# Patient Record
Sex: Male | Born: 1955
Health system: Southern US, Community
[De-identification: ages and names within clinical notes are randomized; demographics above are authoritative.]

## PROBLEM LIST (undated history)

## (undated) DIAGNOSIS — I219 Acute myocardial infarction, unspecified: Secondary | ICD-10-CM

## (undated) DIAGNOSIS — D696 Thrombocytopenia, unspecified: Secondary | ICD-10-CM

## (undated) DIAGNOSIS — K635 Polyp of colon: Secondary | ICD-10-CM

## (undated) DIAGNOSIS — I519 Heart disease, unspecified: Secondary | ICD-10-CM

## (undated) DIAGNOSIS — E039 Hypothyroidism, unspecified: Secondary | ICD-10-CM

## (undated) DIAGNOSIS — G4730 Sleep apnea, unspecified: Secondary | ICD-10-CM

## (undated) DIAGNOSIS — J189 Pneumonia, unspecified organism: Secondary | ICD-10-CM

## (undated) DIAGNOSIS — E119 Type 2 diabetes mellitus without complications: Secondary | ICD-10-CM

## (undated) DIAGNOSIS — T7840XA Allergy, unspecified, initial encounter: Secondary | ICD-10-CM

## (undated) DIAGNOSIS — I10 Essential (primary) hypertension: Secondary | ICD-10-CM

## (undated) DIAGNOSIS — K760 Fatty (change of) liver, not elsewhere classified: Secondary | ICD-10-CM

## (undated) DIAGNOSIS — K602 Anal fissure, unspecified: Secondary | ICD-10-CM

## (undated) DIAGNOSIS — E785 Hyperlipidemia, unspecified: Secondary | ICD-10-CM

## (undated) HISTORY — DX: Thrombocytopenia, unspecified: D69.6

## (undated) HISTORY — DX: Essential (primary) hypertension: I10

## (undated) HISTORY — PX: CARDIAC CATHETERIZATION: SHX172

## (undated) HISTORY — DX: Heart disease, unspecified: I51.9

## (undated) HISTORY — DX: Allergy, unspecified, initial encounter: T78.40XA

## (undated) HISTORY — DX: Hyperlipidemia, unspecified: E78.5

## (undated) HISTORY — PX: NASAL SINUS SURGERY: SHX719

## (undated) HISTORY — DX: Polyp of colon: K63.5

## (undated) HISTORY — PX: ANAL FISSURE REPAIR: SHX2312

## (undated) HISTORY — DX: Type 2 diabetes mellitus without complications: E11.9

## (undated) HISTORY — DX: Acute myocardial infarction, unspecified: I21.9

## (undated) HISTORY — PX: COLONOSCOPY: SHX174

---

## 1998-11-22 DIAGNOSIS — J189 Pneumonia, unspecified organism: Secondary | ICD-10-CM | POA: Insufficient documentation

## 1998-11-22 HISTORY — DX: Pneumonia, unspecified organism: J18.9

## 2003-02-06 ENCOUNTER — Encounter: Payer: Self-pay | Admitting: Internal Medicine

## 2003-02-06 ENCOUNTER — Encounter: Admission: RE | Admit: 2003-02-06 | Discharge: 2003-02-06 | Payer: Self-pay | Admitting: Internal Medicine

## 2003-02-11 ENCOUNTER — Encounter: Admission: RE | Admit: 2003-02-11 | Discharge: 2003-02-11 | Payer: Self-pay | Admitting: Internal Medicine

## 2003-02-11 ENCOUNTER — Encounter: Payer: Self-pay | Admitting: Internal Medicine

## 2006-11-22 DIAGNOSIS — I219 Acute myocardial infarction, unspecified: Secondary | ICD-10-CM

## 2006-11-22 HISTORY — DX: Acute myocardial infarction, unspecified: I21.9

## 2007-03-15 ENCOUNTER — Ambulatory Visit: Payer: Self-pay | Admitting: Cardiothoracic Surgery

## 2010-12-12 ENCOUNTER — Encounter: Payer: Self-pay | Admitting: Sports Medicine

## 2013-11-22 HISTORY — PX: COLOSTOMY: SHX63

## 2014-11-26 LAB — HM COLONOSCOPY

## 2015-05-22 DIAGNOSIS — E782 Mixed hyperlipidemia: Secondary | ICD-10-CM | POA: Insufficient documentation

## 2015-05-22 DIAGNOSIS — I2510 Atherosclerotic heart disease of native coronary artery without angina pectoris: Secondary | ICD-10-CM | POA: Insufficient documentation

## 2015-05-22 DIAGNOSIS — I252 Old myocardial infarction: Secondary | ICD-10-CM

## 2015-05-22 HISTORY — DX: Mixed hyperlipidemia: E78.2

## 2015-05-22 HISTORY — DX: Atherosclerotic heart disease of native coronary artery without angina pectoris: I25.10

## 2015-05-22 HISTORY — DX: Old myocardial infarction: I25.2

## 2015-12-16 DIAGNOSIS — E291 Testicular hypofunction: Secondary | ICD-10-CM | POA: Insufficient documentation

## 2015-12-16 DIAGNOSIS — E039 Hypothyroidism, unspecified: Secondary | ICD-10-CM | POA: Insufficient documentation

## 2015-12-16 DIAGNOSIS — D696 Thrombocytopenia, unspecified: Secondary | ICD-10-CM

## 2015-12-16 DIAGNOSIS — Z8249 Family history of ischemic heart disease and other diseases of the circulatory system: Secondary | ICD-10-CM | POA: Insufficient documentation

## 2015-12-16 HISTORY — DX: Testicular hypofunction: E29.1

## 2015-12-16 HISTORY — DX: Thrombocytopenia, unspecified: D69.6

## 2015-12-16 HISTORY — DX: Family history of ischemic heart disease and other diseases of the circulatory system: Z82.49

## 2015-12-16 HISTORY — DX: Hypothyroidism, unspecified: E03.9

## 2017-04-11 ENCOUNTER — Other Ambulatory Visit (HOSPITAL_COMMUNITY): Payer: Self-pay | Admitting: General Surgery

## 2017-04-27 ENCOUNTER — Encounter: Payer: BLUE CROSS/BLUE SHIELD | Attending: General Surgery | Admitting: Registered"

## 2017-04-27 ENCOUNTER — Encounter: Payer: Self-pay | Admitting: Registered"

## 2017-04-27 ENCOUNTER — Ambulatory Visit: Payer: Self-pay | Admitting: Registered"

## 2017-04-27 DIAGNOSIS — E079 Disorder of thyroid, unspecified: Secondary | ICD-10-CM | POA: Diagnosis not present

## 2017-04-27 DIAGNOSIS — Z6841 Body Mass Index (BMI) 40.0 and over, adult: Secondary | ICD-10-CM | POA: Insufficient documentation

## 2017-04-27 DIAGNOSIS — I10 Essential (primary) hypertension: Secondary | ICD-10-CM | POA: Insufficient documentation

## 2017-04-27 DIAGNOSIS — E1169 Type 2 diabetes mellitus with other specified complication: Secondary | ICD-10-CM | POA: Insufficient documentation

## 2017-04-27 DIAGNOSIS — E669 Obesity, unspecified: Secondary | ICD-10-CM | POA: Diagnosis not present

## 2017-04-27 DIAGNOSIS — E119 Type 2 diabetes mellitus without complications: Secondary | ICD-10-CM

## 2017-04-27 DIAGNOSIS — G4733 Obstructive sleep apnea (adult) (pediatric): Secondary | ICD-10-CM | POA: Insufficient documentation

## 2017-04-27 DIAGNOSIS — Z713 Dietary counseling and surveillance: Secondary | ICD-10-CM | POA: Diagnosis not present

## 2017-04-27 NOTE — Progress Notes (Addendum)
Pre-Op Assessment Visit:  Pre-Operative Sleeve Gastrectomy Surgery  Medical Nutrition Therapy:  Appt start time: 2:05 End time:  3:18  Patient was seen on 04/27/2017 for Pre-Operative Nutrition Assessment. Assessment and letter of approval faxed to [facility] Bariatric Surgery Program coordinator on 04/27/2017.   Pt expectation of surgery: "to get comfortable in my own skin and better health"  Pt expectation of Dietitian: accountability with eating the right things and doing the right things   Start weight at [facility]: 329.5 BMI: 46.61   Pt states he has an intolerance to prednisone tablets especially during summer months.  Pt states he has been newly diagnosed with diabetes for about 5 weeks now.   Pt may have surgery in 2 weeks, during week of 6/25.  Pt is self-pay and unsure of how many SWL visits he'll have.    24 hr Dietary Recall: First Meal: take out - bacon, eggs, grits, bisucuit Snack: sometimes in-season fruit  Second Meal: take out - meat and 3 vegetables Snack: sometimes strawberries when in season Third Meal: meat and vegetables or burger or pizza Snack: watermelon, m&m's Beverages: coffee, half and half tea, water, soda  Encouraged to engage in 150 minutes of moderate physical activity including cardiovascular and weight baring weekly  Handouts given during visit include:  . Pre-Op Goals . Bariatric Surgery Protein Shakes . Vitamin and Mineral Recommendations  During the appointment today the following Pre-Op Goals were reviewed with the patient: . Maintain or lose weight as instructed by your surgeon . Make healthy food choices . Begin to limit portion sizes . Limited concentrated sugars and fried foods . Keep fat/sugar in the single digits per serving on         food labels . Practice CHEWING your food  (aim for 30 chews per bite or until applesauce consistency) . Practice not drinking 15 minutes before, during, and 30 minutes after each  meal/snack . Avoid all carbonated beverages  . Avoid/limit caffeinated beverages  . Avoid all sugar-sweetened beverages . Consume 3 meals per day; eat every 3-5 hours . Make a list of non-food related activities . Aim for 64-100 ounces of FLUID daily  . Aim for at least 60-80 grams of PROTEIN daily . Look for a liquid protein source that contain ?15 g protein and ?5 g carbohydrate  (ex: shakes, drinks, shots) . Physical activity is an important part of a healthy lifestyle so keep it moving!  Follow diet recommendations listed below Energy and Macronutrient Recommendations: Calories: 2000 Carbohydrate: 225 Protein: 150 Fat: 56  Demonstrated degree of understanding via:  Teach Back   Teaching Method Utilized:  Visual Auditory Hands on  Barriers to learning/adherence to lifestyle change: none  Patient to call the [facility] to enroll in Pre-Op and Post-Op Nutrition Education when surgery date is scheduled.

## 2017-05-09 ENCOUNTER — Ambulatory Visit (HOSPITAL_COMMUNITY)
Admission: RE | Admit: 2017-05-09 | Discharge: 2017-05-09 | Disposition: A | Discharge status: Home / Self Care | Payer: BLUE CROSS/BLUE SHIELD | Source: Ambulatory Visit | Attending: General Surgery | Admitting: General Surgery

## 2017-05-09 DIAGNOSIS — I4510 Unspecified right bundle-branch block: Secondary | ICD-10-CM | POA: Diagnosis not present

## 2017-05-09 DIAGNOSIS — Z01812 Encounter for preprocedural laboratory examination: Secondary | ICD-10-CM | POA: Diagnosis present

## 2017-05-09 DIAGNOSIS — Z01810 Encounter for preprocedural cardiovascular examination: Secondary | ICD-10-CM | POA: Diagnosis not present

## 2017-05-16 ENCOUNTER — Encounter: Payer: BLUE CROSS/BLUE SHIELD | Admitting: Skilled Nursing Facility1

## 2017-05-16 DIAGNOSIS — Z713 Dietary counseling and surveillance: Secondary | ICD-10-CM | POA: Diagnosis not present

## 2017-05-16 DIAGNOSIS — E119 Type 2 diabetes mellitus without complications: Secondary | ICD-10-CM

## 2017-05-18 ENCOUNTER — Ambulatory Visit: Payer: Self-pay | Admitting: General Surgery

## 2017-05-18 NOTE — Progress Notes (Signed)
  Pre-Operative Nutrition Class:  Appt start time: 7530   End time:  1830.  Patient was seen on 05/16/17 for Pre-Operative Bariatric Surgery Education [facility].   Surgery date: 05/30/17 Surgery type: Sleeve Gastrectomy Start weight at [facility]: 329.5 Weight today: Pt denied  TANITA  BODY COMP RESULTS     BMI (kg/m^2)    Fat Mass (lbs)    Fat Free Mass (lbs)    Total Body Water (lbs)    Samples given per MNT protocol. Patient educated on appropriate usage: Bariatric Advantage Multivitamin Lot # [id number] Exp: 06/19  Bariatric Fusion Calcium Citrate Lot # [id number] Exp: 03/23/18  Premier Clear Protein Drink Lot # [id number] Exp: 10/18/17 The following the learning objectives were met by the patient during this course:  Identify Pre-Op Dietary Goals and will begin 2 weeks pre-operatively  Identify appropriate sources of fluids and proteins   State protein recommendations and appropriate sources pre and post-operatively  Identify Post-Operative Dietary Goals and will follow for 2 weeks post-operatively  Identify appropriate multivitamin and calcium sources  Describe the need for physical activity post-operatively and will follow MD recommendations  State when to call healthcare provider regarding medication questions or post-operative complications  Handouts given during class include:  Pre-Op Bariatric Surgery Diet Handout  Protein Shake Handout  Post-Op Bariatric Surgery Nutrition Handout  [facility] Program Information Flyer  Support Group Information Flyer  [facility] Bariatric Supplements Price List  Follow-Up Plan: Patient will follow-up at [facility] 2 weeks post operatively for diet advancement per MD.

## 2017-05-23 ENCOUNTER — Telehealth (HOSPITAL_COMMUNITY): Payer: Self-pay

## 2017-05-23 NOTE — Telephone Encounter (Signed)
Spoke with patient about admitting process for bariatric surgery.  Patient for pre admit tomorrow and surgery on July 9th.  Questions answered.

## 2017-05-23 NOTE — Progress Notes (Signed)
LOV [place] 05-11-17 on chart   LOV cardiology Chiu MD 06-09-16 epic  EKG 05-09-17 epic  CXR 05-09-17 epic  HgA1c 05-11-17 on chart

## 2017-05-23 NOTE — Patient Instructions (Addendum)
Scott Bauer  05/23/2017   Your procedure is scheduled on: 05-30-17  Report to [facility] Main  Entrance Take [facility]  elevators to 3rd floor to  [place] at 11:15AM.    Call this number if you have problems the morning of surgery [phone]   Remember: ONLY 1 PERSON MAY GO WITH YOU TO SHORT STAY TO GET  READY MORNING OF [place].  Do not eat food After Midnight. You may have clear liquids form midnight until 715am day of surgery. Nothing by mouth after 715am!     Take these medicines the morning of surgery with A SIP OF WATER: carvedilol(coreg), levothyroxine(synthroid)   DO NOT TAKE ANY DIABETIC MEDICATIONS DAY OF YOUR SURGERY                               You may not have any metal on your body including hair pins and              piercings  Do not wear jewelry, make-up, lotions, powders or perfumes, deodorant                   Men may shave face and neck.   Do not bring valuables to the hospital. [place].  Contacts, dentures or bridgework may not be worn into surgery.  Leave suitcase in the car. After surgery it may be brought to your room.                Please read over the following fact sheets you were given: _____________________________________________________________________    CLEAR LIQUID DIET   Foods Allowed                                                                     Foods Excluded  Coffee and tea, regular and decaf                             liquids that you cannot  Plain Jell-O in any flavor                                             see through such as: Fruit ices (not with fruit pulp)                                     milk, soups, orange juice  Iced Popsicles                                    All solid food Carbonated beverages, regular and diet  Cranberry, grape and apple juices Sports drinks like Gatorade Lightly  seasoned clear broth or consume(fat free) Sugar, honey syrup  Sample Menu Breakfast                                Lunch                                     Supper Cranberry juice                    Beef broth                            Chicken broth Jell-O                                     Grape juice                           Apple juice Coffee or tea                        Jell-O                                      Popsicle                                                Coffee or tea                        Coffee or tea  _____________________________________________________________________ How to Manage Your Diabetes Before and After Surgery  Why is it important to control my blood sugar before and after surgery? . Improving blood sugar levels before and after surgery helps healing and can limit problems. . A way of improving blood sugar control is eating a healthy diet by: o  Eating less sugar and carbohydrates o  Increasing activity/exercise o  Talking with your doctor about reaching your blood sugar goals . High blood sugars (greater than 180 mg/dL) can raise your risk of infections and slow your recovery, so you will need to focus on controlling your diabetes during the weeks before surgery. . Make sure that the doctor who takes care of your diabetes knows about your planned surgery including the date and location.  How do I manage my blood sugar before surgery? . Check your blood sugar at least 4 times a day, starting 2 days before surgery, to make sure that the level is not too high or low. o Check your blood sugar the morning of your surgery when you wake up and every 2 hours until you get to the Short Stay unit. . If your blood sugar is less than 70 mg/dL, you will need to treat for low blood sugar: o Do not take insulin. o Treat a low blood sugar (less than 70 mg/dL) with  cup of clear juice (cranberry or apple), 4 glucose tablets, OR glucose gel. o Recheck  blood sugar in  15 minutes after treatment (to make sure it is greater than 70 mg/dL). If your blood sugar is not greater than 70 mg/dL on recheck, call [phone] for further instructions. . Report your blood sugar to the short stay nurse when you get to Short Stay.  . If you are admitted to the hospital after surgery: o Your blood sugar will be checked by the staff and you will probably be given insulin after surgery (instead of oral diabetes medicines) to make sure you have good blood sugar levels. o The goal for blood sugar control after surgery is 80-180 mg/dL.   WHAT DO I DO ABOUT MY DIABETES MEDICATION?   . THE DAY BEFORE SURGERY, take  METFORMIN and SAXAGLIPTIN(ONGLYZA) as usual.       . THE MORNING OF SURGERY, Do not take oral diabetes medicines (pills)   Patient Signature:  Date:   Nurse Signature:  Date:   Reviewed and Endorsed by [place] Patient Education Committee, August 2015   [facility] - Preparing for Surgery Before surgery, you can play an important role.  Because skin is not sterile, your skin needs to be as free of germs as possible.  You can reduce the number of germs on your skin by washing with CHG (chlorahexidine gluconate) soap before surgery.  CHG is an antiseptic cleaner which kills germs and bonds with the skin to continue killing germs even after washing. Please DO NOT use if you have an allergy to CHG or antibacterial soaps.  If your skin becomes reddened/irritated stop using the CHG and inform your nurse when you arrive at Short Stay. Do not shave (including legs and underarms) for at least 48 hours prior to the first CHG shower.  You may shave your face/neck. Please follow these instructions carefully:  1.  Shower with CHG Soap the night before surgery and the  morning of Surgery.  2.  If you choose to wash your hair, wash your hair first as usual with your  normal  shampoo.  3.  After you shampoo, rinse your hair and body thoroughly to remove the  shampoo.                            4.  Use CHG as you would any other liquid soap.  You can apply chg directly  to the skin and wash                       Gently with a scrungie or clean washcloth.  5.  Apply the CHG Soap to your body ONLY FROM THE NECK DOWN.   Do not use on face/ open                           Wound or open sores. Avoid contact with eyes, ears mouth and genitals (private parts).                       Wash face,  Genitals (private parts) with your normal soap.             6.  Wash thoroughly, paying special attention to the area where your surgery  will be performed.  7.  Thoroughly rinse your body with warm water from the neck down.  8.  DO NOT shower/wash with your normal soap after using and rinsing off  the  CHG Soap.                9.  Pat yourself dry with a clean towel.            10.  Wear clean pajamas.            11.  Place clean sheets on your bed the night of your first shower and do not  sleep with pets. Day of Surgery : Do not apply any lotions/deodorants the morning of surgery.  Please wear clean clothes to the hospital/surgery center.  FAILURE TO FOLLOW THESE INSTRUCTIONS MAY RESULT IN THE CANCELLATION OF YOUR SURGERY PATIENT SIGNATURE_________________________________  NURSE SIGNATURE__________________________________  ________________________________________________________________________   Adam Phenix  An incentive spirometer is a tool that can help keep your lungs clear and active. This tool measures how well you are filling your lungs with each breath. Taking long deep breaths may help reverse or decrease the chance of developing breathing (pulmonary) problems (especially infection) following:  A long period of time when you are unable to move or be active. BEFORE THE PROCEDURE   If the spirometer includes an indicator to show your best effort, your nurse or respiratory therapist will set it to a desired goal.  If possible, sit up straight or lean  slightly forward. Try not to slouch.  Hold the incentive spirometer in an upright position. INSTRUCTIONS FOR USE  1. Sit on the edge of your bed if possible, or sit up as far as you can in bed or on a chair. 2. Hold the incentive spirometer in an upright position. 3. Breathe out normally. 4. Place the mouthpiece in your mouth and seal your lips tightly around it. 5. Breathe in slowly and as deeply as possible, raising the piston or the ball toward the top of the column. 6. Hold your breath for 3-5 seconds or for as long as possible. Allow the piston or ball to fall to the bottom of the column. 7. Remove the mouthpiece from your mouth and breathe out normally. 8. Rest for a few seconds and repeat Steps 1 through 7 at least 10 times every 1-2 hours when you are awake. Take your time and take a few normal breaths between deep breaths. 9. The spirometer may include an indicator to show your best effort. Use the indicator as a goal to work toward during each repetition. 10. After each set of 10 deep breaths, practice coughing to be sure your lungs are clear. If you have an incision (the cut made at the time of surgery), support your incision when coughing by placing a pillow or rolled up towels firmly against it. Once you are able to get out of bed, walk around indoors and cough well. You may stop using the incentive spirometer when instructed by your caregiver.  RISKS AND COMPLICATIONS  Take your time so you do not get dizzy or light-headed.  If you are in pain, you may need to take or ask for pain medication before doing incentive spirometry. It is harder to take a deep breath if you are having pain. AFTER USE  Rest and breathe slowly and easily.  It can be helpful to keep track of a log of your progress. Your caregiver can provide you with a simple table to help with this. If you are using the spirometer at home, follow these instructions: [place] IF:   You are having difficultly  using the spirometer.  You have trouble using the spirometer as often  as instructed.  Your pain medication is not giving enough relief while using the spirometer.  You develop fever of 100.5 F (38.1 C) or higher. SEEK IMMEDIATE MEDICAL CARE IF:   You cough up bloody sputum that had not been present before.  You develop fever of 102 F (38.9 C) or greater.  You develop worsening pain at or near the incision site. MAKE SURE YOU:   Understand these instructions.  Will watch your condition.  Will get help right away if you are not doing well or get worse. Document Released: 03/21/2007 Document Revised: 01/31/2012 Document Reviewed: 05/22/2007 [facility] Patient Information 2014 [place], [place].   ________________________________________________________________________

## 2017-05-24 ENCOUNTER — Encounter (HOSPITAL_COMMUNITY)
Admission: RE | Admit: 2017-05-24 | Discharge: 2017-05-24 | Disposition: A | Discharge status: Home / Self Care | Payer: BLUE CROSS/BLUE SHIELD | Source: Ambulatory Visit | Attending: General Surgery | Admitting: General Surgery

## 2017-05-24 ENCOUNTER — Encounter (HOSPITAL_COMMUNITY): Payer: Self-pay

## 2017-05-24 DIAGNOSIS — I10 Essential (primary) hypertension: Secondary | ICD-10-CM | POA: Insufficient documentation

## 2017-05-24 DIAGNOSIS — Z01812 Encounter for preprocedural laboratory examination: Secondary | ICD-10-CM | POA: Insufficient documentation

## 2017-05-24 DIAGNOSIS — Z0183 Encounter for blood typing: Secondary | ICD-10-CM | POA: Insufficient documentation

## 2017-05-24 DIAGNOSIS — G4733 Obstructive sleep apnea (adult) (pediatric): Secondary | ICD-10-CM | POA: Insufficient documentation

## 2017-05-24 DIAGNOSIS — Z6841 Body Mass Index (BMI) 40.0 and over, adult: Secondary | ICD-10-CM | POA: Insufficient documentation

## 2017-05-24 HISTORY — DX: Pneumonia, unspecified organism: J18.9

## 2017-05-24 HISTORY — DX: Hypothyroidism, unspecified: E03.9

## 2017-05-24 HISTORY — DX: Anal fissure, unspecified: K60.2

## 2017-05-24 HISTORY — DX: Sleep apnea, unspecified: G47.30

## 2017-05-24 HISTORY — DX: Fatty (change of) liver, not elsewhere classified: K76.0

## 2017-05-24 LAB — COMPREHENSIVE METABOLIC PANEL
ALBUMIN: 4.3 g/dL (ref 3.5–5.0)
ALK PHOS: 108 U/L (ref 38–126)
ALT: 53 U/L (ref 17–63)
ANION GAP: 9 (ref 5–15)
AST: 34 U/L (ref 15–41)
BILIRUBIN TOTAL: 0.8 mg/dL (ref 0.3–1.2)
BUN: 18 mg/dL (ref 6–20)
CALCIUM: 9.2 mg/dL (ref 8.9–10.3)
CO2: 22 mmol/L (ref 22–32)
Chloride: 106 mmol/L (ref 101–111)
Creatinine, Ser: 0.71 mg/dL (ref 0.61–1.24)
GFR calc Af Amer: >60 mL/min (ref >60)
GFR calc non Af Amer: >60 mL/min (ref >60)
GLUCOSE: 113 mg/dL — AB (ref 65–99)
POTASSIUM: 3.7 mmol/L (ref 3.5–5.1)
Sodium: 137 mmol/L (ref 135–145)
TOTAL PROTEIN: 7.6 g/dL (ref 6.5–8.1)

## 2017-05-24 LAB — CBC WITH DIFFERENTIAL/PLATELET
BASOS ABS: 0 10*3/uL (ref 0.0–0.1)
BASOS PCT: 0 %
EOS ABS: 0.2 10*3/uL (ref 0.0–0.7)
Eosinophils Relative: 2 %
HEMATOCRIT: 40.8 % (ref 39.0–52.0)
HEMOGLOBIN: 14.6 g/dL (ref 13.0–17.0)
Lymphocytes Relative: 25 %
Lymphs Abs: 2 10*3/uL (ref 0.7–4.0)
MCH: 30.1 pg (ref 26.0–34.0)
MCHC: 35.8 g/dL (ref 30.0–36.0)
MCV: 84.1 fL (ref 78.0–100.0)
Monocytes Absolute: 0.8 10*3/uL (ref 0.1–1.0)
Monocytes Relative: 9 %
NEUTROS ABS: 5.4 10*3/uL (ref 1.7–7.7)
NEUTROS PCT: 64 %
Platelets: 133 10*3/uL — ABNORMAL LOW (ref 150–400)
RBC: 4.85 MIL/uL (ref 4.22–5.81)
RDW: 13.2 % (ref 11.5–15.5)
WBC: 8.3 10*3/uL (ref 4.0–10.5)

## 2017-05-24 LAB — ABO/RH: ABO/RH(D): O POS

## 2017-05-24 LAB — GLUCOSE, CAPILLARY: Glucose-Capillary: 136 mg/dL — ABNORMAL HIGH (ref 65–99)

## 2017-05-24 MED FILL — oxyCODONE HCL 5 MG/5ML SOLN: 5 | 2 days supply | Qty: 100 | Fill #0

## 2017-05-24 NOTE — Progress Notes (Signed)
Cardiology clearance Dr Wyline Copas on chart

## 2017-05-24 NOTE — Progress Notes (Signed)
RN called medical records at [place] , [place] requesting copy of cardiac clearance from Dr Wyline Copas

## 2017-05-30 ENCOUNTER — Observation Stay (HOSPITAL_COMMUNITY)
Admission: RE | Admit: 2017-05-30 | Discharge: 2017-05-31 | Disposition: A | Discharge status: Home / Self Care | Payer: Self-pay | Source: Ambulatory Visit | Attending: General Surgery | Admitting: General Surgery

## 2017-05-30 ENCOUNTER — Ambulatory Visit (HOSPITAL_COMMUNITY): Payer: BLUE CROSS/BLUE SHIELD | Admitting: Registered Nurse

## 2017-05-30 ENCOUNTER — Encounter (HOSPITAL_COMMUNITY)
Admission: RE | Disposition: A | Discharge status: Home / Self Care | Payer: Self-pay | Source: Ambulatory Visit | Attending: General Surgery

## 2017-05-30 ENCOUNTER — Encounter (HOSPITAL_COMMUNITY): Payer: Self-pay | Admitting: *Deleted

## 2017-05-30 DIAGNOSIS — E7800 Pure hypercholesterolemia, unspecified: Secondary | ICD-10-CM

## 2017-05-30 DIAGNOSIS — Z9989 Dependence on other enabling machines and devices: Secondary | ICD-10-CM | POA: Insufficient documentation

## 2017-05-30 DIAGNOSIS — Z7902 Long term (current) use of antithrombotics/antiplatelets: Secondary | ICD-10-CM | POA: Insufficient documentation

## 2017-05-30 DIAGNOSIS — Z6841 Body Mass Index (BMI) 40.0 and over, adult: Secondary | ICD-10-CM | POA: Insufficient documentation

## 2017-05-30 DIAGNOSIS — G4733 Obstructive sleep apnea (adult) (pediatric): Secondary | ICD-10-CM

## 2017-05-30 DIAGNOSIS — E1169 Type 2 diabetes mellitus with other specified complication: Secondary | ICD-10-CM | POA: Diagnosis present

## 2017-05-30 DIAGNOSIS — I10 Essential (primary) hypertension: Secondary | ICD-10-CM | POA: Diagnosis present

## 2017-05-30 DIAGNOSIS — E66813 Obesity, class 3: Secondary | ICD-10-CM

## 2017-05-30 DIAGNOSIS — T884XXA Failed or difficult intubation, initial encounter: Secondary | ICD-10-CM

## 2017-05-30 DIAGNOSIS — Z87891 Personal history of nicotine dependence: Secondary | ICD-10-CM | POA: Insufficient documentation

## 2017-05-30 DIAGNOSIS — E119 Type 2 diabetes mellitus without complications: Secondary | ICD-10-CM | POA: Insufficient documentation

## 2017-05-30 DIAGNOSIS — Z888 Allergy status to other drugs, medicaments and biological substances status: Secondary | ICD-10-CM | POA: Insufficient documentation

## 2017-05-30 DIAGNOSIS — I252 Old myocardial infarction: Secondary | ICD-10-CM | POA: Insufficient documentation

## 2017-05-30 DIAGNOSIS — E669 Obesity, unspecified: Secondary | ICD-10-CM | POA: Diagnosis present

## 2017-05-30 DIAGNOSIS — Z79899 Other long term (current) drug therapy: Secondary | ICD-10-CM | POA: Insufficient documentation

## 2017-05-30 DIAGNOSIS — Z7984 Long term (current) use of oral hypoglycemic drugs: Secondary | ICD-10-CM | POA: Insufficient documentation

## 2017-05-30 DIAGNOSIS — Z7982 Long term (current) use of aspirin: Secondary | ICD-10-CM | POA: Insufficient documentation

## 2017-05-30 DIAGNOSIS — K219 Gastro-esophageal reflux disease without esophagitis: Secondary | ICD-10-CM | POA: Insufficient documentation

## 2017-05-30 DIAGNOSIS — E039 Hypothyroidism, unspecified: Secondary | ICD-10-CM | POA: Insufficient documentation

## 2017-05-30 DIAGNOSIS — I4510 Unspecified right bundle-branch block: Secondary | ICD-10-CM | POA: Insufficient documentation

## 2017-05-30 HISTORY — PX: LAPAROSCOPIC GASTRIC SLEEVE RESECTION: SHX5895

## 2017-05-30 HISTORY — DX: Morbid (severe) obesity due to excess calories: E66.01

## 2017-05-30 HISTORY — DX: Pure hypercholesterolemia, unspecified: E78.00

## 2017-05-30 HISTORY — DX: Failed or difficult intubation, initial encounter: T88.4XXA

## 2017-05-30 HISTORY — DX: Obstructive sleep apnea (adult) (pediatric): G47.33

## 2017-05-30 HISTORY — DX: Essential (primary) hypertension: I10

## 2017-05-30 HISTORY — DX: Obesity, class 3: E66.813

## 2017-05-30 HISTORY — DX: Type 2 diabetes mellitus with other specified complication: E66.9

## 2017-05-30 HISTORY — DX: Type 2 diabetes mellitus with other specified complication: E11.69

## 2017-05-30 LAB — TYPE AND SCREEN
ABO/RH(D): O POS
Antibody Screen: NEGATIVE

## 2017-05-30 LAB — GLUCOSE, CAPILLARY
GLUCOSE-CAPILLARY: 104 mg/dL — AB (ref 65–99)
GLUCOSE-CAPILLARY: 159 mg/dL — AB (ref 65–99)
Glucose-Capillary: 160 mg/dL — ABNORMAL HIGH (ref 65–99)

## 2017-05-30 LAB — HEMOGLOBIN AND HEMATOCRIT, BLOOD
HEMATOCRIT: 40.3 % (ref 39.0–52.0)
HEMOGLOBIN: 14.3 g/dL (ref 13.0–17.0)

## 2017-05-30 SURGERY — GASTRECTOMY, SLEEVE, LAPAROSCOPIC
Anesthesia: General | Site: Abdomen

## 2017-05-30 MED ORDER — 0.9 % SODIUM CHLORIDE (POUR BTL) OPTIME
TOPICAL | Status: DC | PRN
  Administered 2017-05-30: 1000 mL

## 2017-05-30 MED ORDER — BUPIVACAINE LIPOSOME 1.3 % IJ SUSP
20.0000 mL | Freq: Once | INTRAMUSCULAR | Status: AC
  Administered 2017-05-30: 20 mL
  Filled 2017-05-30: qty 20

## 2017-05-30 MED ORDER — SUGAMMADEX SODIUM 500 MG/5ML IV SOLN
INTRAVENOUS | Status: AC
  Filled 2017-05-30: qty 5

## 2017-05-30 MED ORDER — SCOPOLAMINE 1 MG/3DAYS TD PT72
1.0000 | MEDICATED_PATCH | TRANSDERMAL | Status: DC
  Administered 2017-05-30: 1.5 mg via TRANSDERMAL

## 2017-05-30 MED ORDER — APREPITANT 40 MG PO CAPS
40.0000 mg | ORAL_CAPSULE | ORAL | Status: AC
  Administered 2017-05-30: 40 mg via ORAL
  Filled 2017-05-30: qty 1

## 2017-05-30 MED ORDER — DEXAMETHASONE SODIUM PHOSPHATE 10 MG/ML IJ SOLN
INTRAMUSCULAR | Status: DC | PRN
  Administered 2017-05-30: 10 mg via INTRAVENOUS

## 2017-05-30 MED ORDER — POTASSIUM CHLORIDE IN NACL 20-0.45 MEQ/L-% IV SOLN
INTRAVENOUS | Status: DC
  Administered 2017-05-30 – 2017-05-31 (×3): via INTRAVENOUS
  Filled 2017-05-30 (×4): qty 1000

## 2017-05-30 MED ORDER — LIDOCAINE 2% (20 MG/ML) 5 ML SYRINGE
INTRAMUSCULAR | Status: AC
  Filled 2017-05-30: qty 5

## 2017-05-30 MED ORDER — SUCCINYLCHOLINE CHLORIDE 200 MG/10ML IV SOSY
PREFILLED_SYRINGE | INTRAVENOUS | Status: AC
  Filled 2017-05-30: qty 10

## 2017-05-30 MED ORDER — MIDAZOLAM HCL 5 MG/5ML IJ SOLN
INTRAMUSCULAR | Status: DC | PRN
  Administered 2017-05-30: 2 mg via INTRAVENOUS

## 2017-05-30 MED ORDER — ENOXAPARIN SODIUM 30 MG/0.3ML ~~LOC~~ SOLN
30.0000 mg | Freq: Two times a day (BID) | SUBCUTANEOUS | Status: DC
  Administered 2017-05-30 – 2017-05-31 (×2): 30 mg via SUBCUTANEOUS
  Filled 2017-05-30 (×2): qty 0.3

## 2017-05-30 MED ORDER — SIMETHICONE 80 MG PO CHEW: 80.0000 mg | CHEWABLE_TABLET | Freq: Four times a day (QID) | ORAL | Status: DC | PRN

## 2017-05-30 MED ORDER — OXYCODONE HCL 5 MG/5ML PO SOLN
5.0000 mg | ORAL | Status: DC | PRN
  Administered 2017-05-31: 5 mg via ORAL
  Administered 2017-05-31: 10 mg via ORAL
  Administered 2017-05-31: 5 mg via ORAL
  Filled 2017-05-30: qty 5
  Filled 2017-05-30: qty 10
  Filled 2017-05-30: qty 5

## 2017-05-30 MED ORDER — BUPIVACAINE HCL (PF) 0.25 % IJ SOLN
INTRAMUSCULAR | Status: DC | PRN
  Administered 2017-05-30: 5 mL

## 2017-05-30 MED ORDER — PROPOFOL 10 MG/ML IV BOLUS
INTRAVENOUS | Status: DC | PRN
  Administered 2017-05-30: 100 mg via INTRAVENOUS
  Administered 2017-05-30: 250 mg via INTRAVENOUS

## 2017-05-30 MED ORDER — ONDANSETRON HCL 4 MG/2ML IJ SOLN: 4.0000 mg | Freq: Once | INTRAMUSCULAR | Status: DC | PRN

## 2017-05-30 MED ORDER — LABETALOL HCL 5 MG/ML IV SOLN
INTRAVENOUS | Status: DC | PRN
  Administered 2017-05-30: 2.5 mg via INTRAVENOUS

## 2017-05-30 MED ORDER — CEFOTETAN DISODIUM-DEXTROSE 2-2.08 GM-% IV SOLR
2.0000 g | INTRAVENOUS | Status: AC
Start: 2017-05-30 — End: 2017-05-30
  Administered 2017-05-30: 2 g via INTRAVENOUS
  Filled 2017-05-30: qty 50

## 2017-05-30 MED ORDER — METOPROLOL TARTRATE 5 MG/5ML IV SOLN
5.0000 mg | Freq: Four times a day (QID) | INTRAVENOUS | Status: DC
  Administered 2017-05-30 – 2017-05-31 (×2): 5 mg via INTRAVENOUS
  Filled 2017-05-30 (×3): qty 5

## 2017-05-30 MED ORDER — SODIUM CHLORIDE 0.9 % IJ SOLN
INTRAMUSCULAR | Status: AC
  Filled 2017-05-30: qty 50

## 2017-05-30 MED ORDER — GABAPENTIN 300 MG PO CAPS
300.0000 mg | ORAL_CAPSULE | ORAL | Status: AC
  Administered 2017-05-30: 300 mg via ORAL
  Filled 2017-05-30: qty 1

## 2017-05-30 MED ORDER — SUGAMMADEX SODIUM 500 MG/5ML IV SOLN
INTRAVENOUS | Status: DC | PRN
  Administered 2017-05-30: 300 mg via INTRAVENOUS

## 2017-05-30 MED ORDER — CHLORHEXIDINE GLUCONATE 4 % EX LIQD: 60.0000 mL | Freq: Once | CUTANEOUS | Status: DC

## 2017-05-30 MED ORDER — FENTANYL CITRATE (PF) 100 MCG/2ML IJ SOLN
25.0000 ug | INTRAMUSCULAR | Status: DC | PRN
  Administered 2017-05-30 (×3): 50 ug via INTRAVENOUS

## 2017-05-30 MED ORDER — ACETAMINOPHEN 500 MG PO TABS
1000.0000 mg | ORAL_TABLET | ORAL | Status: AC
  Administered 2017-05-30: 1000 mg via ORAL
  Filled 2017-05-30: qty 2

## 2017-05-30 MED ORDER — ACETAMINOPHEN 325 MG PO TABS: 650.0000 mg | ORAL_TABLET | ORAL | Status: DC | PRN

## 2017-05-30 MED ORDER — PANTOPRAZOLE SODIUM 40 MG IV SOLR
40.0000 mg | Freq: Every day | INTRAVENOUS | Status: DC
  Administered 2017-05-30: 40 mg via INTRAVENOUS
  Filled 2017-05-30: qty 40

## 2017-05-30 MED ORDER — HEPARIN SODIUM (PORCINE) 5000 UNIT/ML IJ SOLN
5000.0000 [IU] | INTRAMUSCULAR | Status: AC
  Administered 2017-05-30: 5000 [IU] via SUBCUTANEOUS
  Filled 2017-05-30: qty 1

## 2017-05-30 MED ORDER — ROCURONIUM BROMIDE 10 MG/ML (PF) SYRINGE
PREFILLED_SYRINGE | INTRAVENOUS | Status: DC | PRN
  Administered 2017-05-30: 20 mg via INTRAVENOUS
  Administered 2017-05-30: 10 mg via INTRAVENOUS
  Administered 2017-05-30: 50 mg via INTRAVENOUS
  Administered 2017-05-30: 10 mg via INTRAVENOUS

## 2017-05-30 MED ORDER — DIPHENHYDRAMINE HCL 50 MG/ML IJ SOLN: 12.5000 mg | Freq: Three times a day (TID) | INTRAMUSCULAR | Status: DC | PRN

## 2017-05-30 MED ORDER — ROCURONIUM BROMIDE 50 MG/5ML IV SOSY
PREFILLED_SYRINGE | INTRAVENOUS | Status: AC
  Filled 2017-05-30: qty 5

## 2017-05-30 MED ORDER — LACTATED RINGERS IV SOLN
INTRAVENOUS | Status: DC
  Administered 2017-05-30: 13:00:00 via INTRAVENOUS

## 2017-05-30 MED ORDER — FENTANYL CITRATE (PF) 250 MCG/5ML IJ SOLN
INTRAMUSCULAR | Status: AC
  Filled 2017-05-30: qty 5

## 2017-05-30 MED ORDER — MIDAZOLAM HCL 2 MG/2ML IJ SOLN
INTRAMUSCULAR | Status: AC
  Filled 2017-05-30: qty 2

## 2017-05-30 MED ORDER — MORPHINE SULFATE (PF) 2 MG/ML IV SOLN
1.0000 mg | INTRAVENOUS | Status: DC | PRN
  Administered 2017-05-30 – 2017-05-31 (×2): 2 mg via INTRAVENOUS
  Filled 2017-05-30 (×2): qty 1

## 2017-05-30 MED ORDER — ONDANSETRON HCL 4 MG/2ML IJ SOLN
4.0000 mg | Freq: Four times a day (QID) | INTRAMUSCULAR | Status: DC | PRN
  Administered 2017-05-30: 4 mg via INTRAVENOUS
  Filled 2017-05-30: qty 2

## 2017-05-30 MED ORDER — INSULIN ASPART 100 UNIT/ML ~~LOC~~ SOLN
0.0000 [IU] | SUBCUTANEOUS | Status: DC
  Administered 2017-05-30: 4 [IU] via SUBCUTANEOUS

## 2017-05-30 MED ORDER — FENTANYL CITRATE (PF) 100 MCG/2ML IJ SOLN
INTRAMUSCULAR | Status: DC | PRN
  Administered 2017-05-30: 100 ug via INTRAVENOUS
  Administered 2017-05-30 (×3): 50 ug via INTRAVENOUS

## 2017-05-30 MED ORDER — BUPIVACAINE LIPOSOME 1.3 % IJ SUSP
20.0000 mL | Freq: Once | INTRAMUSCULAR | Status: DC
  Filled 2017-05-30: qty 20

## 2017-05-30 MED ORDER — PREMIER PROTEIN SHAKE: 2.0000 [oz_av] | ORAL | Status: DC

## 2017-05-30 MED ORDER — SODIUM CHLORIDE 0.9 % IJ SOLN
INTRAMUSCULAR | Status: DC | PRN
  Administered 2017-05-30: 50 mL

## 2017-05-30 MED ORDER — MEPERIDINE HCL 50 MG/ML IJ SOLN: 6.2500 mg | INTRAMUSCULAR | Status: DC | PRN

## 2017-05-30 MED ORDER — ONDANSETRON HCL 4 MG/2ML IJ SOLN
INTRAMUSCULAR | Status: AC
  Filled 2017-05-30: qty 2

## 2017-05-30 MED ORDER — SUCCINYLCHOLINE CHLORIDE 200 MG/10ML IV SOSY
PREFILLED_SYRINGE | INTRAVENOUS | Status: DC | PRN
  Administered 2017-05-30: 160 mg via INTRAVENOUS
  Administered 2017-05-30: 40 mg via INTRAVENOUS

## 2017-05-30 MED ORDER — FENTANYL CITRATE (PF) 100 MCG/2ML IJ SOLN
INTRAMUSCULAR | Status: AC
  Administered 2017-05-30: 50 ug via INTRAVENOUS
  Filled 2017-05-30: qty 4

## 2017-05-30 MED ORDER — BUPIVACAINE HCL (PF) 0.25 % IJ SOLN
INTRAMUSCULAR | Status: AC
  Filled 2017-05-30: qty 30

## 2017-05-30 MED ORDER — DEXAMETHASONE SODIUM PHOSPHATE 4 MG/ML IJ SOLN
4.0000 mg | INTRAMUSCULAR | Status: DC
  Filled 2017-05-30: qty 1

## 2017-05-30 MED ORDER — SCOPOLAMINE 1 MG/3DAYS TD PT72
MEDICATED_PATCH | TRANSDERMAL | Status: AC
  Filled 2017-05-30: qty 1

## 2017-05-30 MED ORDER — PROPOFOL 10 MG/ML IV BOLUS
INTRAVENOUS | Status: AC
  Filled 2017-05-30: qty 40

## 2017-05-30 MED ORDER — ACETAMINOPHEN 160 MG/5ML PO SOLN
325.0000 mg | ORAL | Status: DC | PRN
  Administered 2017-05-31: 650 mg via ORAL
  Filled 2017-05-30 (×2): qty 20.3

## 2017-05-30 MED ORDER — LIDOCAINE 2% (20 MG/ML) 5 ML SYRINGE
INTRAMUSCULAR | Status: DC | PRN
  Administered 2017-05-30: 100 mg via INTRAVENOUS

## 2017-05-30 MED ORDER — LACTATED RINGERS IR SOLN
Status: DC | PRN
Start: 2017-05-30 — End: 2017-05-30
  Administered 2017-05-30: 1000 mL

## 2017-05-30 MED ORDER — PROMETHAZINE HCL 25 MG/ML IJ SOLN: 12.5000 mg | Freq: Four times a day (QID) | INTRAMUSCULAR | Status: DC | PRN

## 2017-05-30 MED ORDER — LABETALOL HCL 5 MG/ML IV SOLN
INTRAVENOUS | Status: AC
  Filled 2017-05-30: qty 4

## 2017-05-30 SURGICAL SUPPLY — 69 items
APPLICATOR COTTON TIP 6IN STRL (MISCELLANEOUS) IMPLANT
APPLIER CLIP ROT 10 11.4 M/L (STAPLE)
APPLIER CLIP ROT 13.4 12 LRG (CLIP)
BANDAGE ADH SHEER 1  50/CT (GAUZE/BANDAGES/DRESSINGS) ×18 IMPLANT
BENZOIN TINCTURE PRP APPL 2/3 (GAUZE/BANDAGES/DRESSINGS) ×3 IMPLANT
BLADE SURG SZ11 CARB STEEL (BLADE) ×3 IMPLANT
CABLE HIGH FREQUENCY MONO STRZ (ELECTRODE) ×3 IMPLANT
CHLORAPREP W/TINT 26ML (MISCELLANEOUS) ×6 IMPLANT
CLIP APPLIE ROT 10 11.4 M/L (STAPLE) IMPLANT
CLIP APPLIE ROT 13.4 12 LRG (CLIP) IMPLANT
CLOSURE WOUND 1/2 X4 (GAUZE/BANDAGES/DRESSINGS) ×1
DECANTER SPIKE VIAL GLASS SM (MISCELLANEOUS) IMPLANT
DERMABOND ADVANCED (GAUZE/BANDAGES/DRESSINGS)
DERMABOND ADVANCED .7 DNX12 (GAUZE/BANDAGES/DRESSINGS) IMPLANT
DEVICE SUT QUICK LOAD TK 5 (STAPLE) IMPLANT
DEVICE SUT TI-KNOT TK 5X26 (MISCELLANEOUS) IMPLANT
DEVICE SUTURE ENDOST 10MM (ENDOMECHANICALS) IMPLANT
DEVICE TI KNOT TK5 (MISCELLANEOUS)
DRAPE UTILITY XL STRL (DRAPES) ×6 IMPLANT
ELECT L-HOOK LAP 45CM DISP (ELECTROSURGICAL)
ELECT PENCIL ROCKER SW 15FT (MISCELLANEOUS) IMPLANT
ELECT REM PT RETURN 15FT ADLT (MISCELLANEOUS) IMPLANT
ELECTRODE L-HOOK LAP 45CM DISP (ELECTROSURGICAL) IMPLANT
GAUZE SPONGE 2X2 8PLY STRL LF (GAUZE/BANDAGES/DRESSINGS) IMPLANT
GAUZE SPONGE 4X4 12PLY STRL (GAUZE/BANDAGES/DRESSINGS) IMPLANT
GLOVE BIO SURGEON STRL SZ7.5 (GLOVE) ×3 IMPLANT
GLOVE INDICATOR 8.0 STRL GRN (GLOVE) ×3 IMPLANT
GOWN STRL REUS W/TWL XL LVL3 (GOWN DISPOSABLE) ×9 IMPLANT
GRASPER SUT TROCAR 14GX15 (MISCELLANEOUS) ×3 IMPLANT
HOVERMATT SINGLE USE (MISCELLANEOUS) ×3 IMPLANT
KIT BASIN OR (CUSTOM PROCEDURE TRAY) ×3 IMPLANT
MARKER SKIN DUAL TIP RULER LAB (MISCELLANEOUS) ×3 IMPLANT
NEEDLE SPNL 22GX3.5 QUINCKE BK (NEEDLE) ×3 IMPLANT
PACK UNIVERSAL I (CUSTOM PROCEDURE TRAY) ×3 IMPLANT
QUICK LOAD TK 5 (STAPLE)
RELOAD STAPLER BLUE 60MM (STAPLE) ×3 IMPLANT
RELOAD STAPLER GOLD 60MM (STAPLE) ×2 IMPLANT
RELOAD STAPLER GREEN 60MM (STAPLE) ×2 IMPLANT
SCISSORS LAP 5X45 EPIX DISP (ENDOMECHANICALS) IMPLANT
SEALANT SURGICAL APPL DUAL CAN (MISCELLANEOUS) IMPLANT
SET IRRIG TUBING LAPAROSCOPIC (IRRIGATION / IRRIGATOR) ×3 IMPLANT
SHEARS HARMONIC ACE PLUS 45CM (MISCELLANEOUS) ×3 IMPLANT
SLEEVE ADV FIXATION 5X100MM (TROCAR) IMPLANT
SLEEVE GASTRECTOMY 40FR VISIGI (MISCELLANEOUS) ×3 IMPLANT
SOLUTION ANTI FOG 6CC (MISCELLANEOUS) ×3 IMPLANT
SPONGE GAUZE 2X2 STER 10/PKG (GAUZE/BANDAGES/DRESSINGS)
SPONGE LAP 18X18 X RAY DECT (DISPOSABLE) ×6 IMPLANT
STAPLER ECHELON BIOABSB 60 FLE (MISCELLANEOUS) ×18 IMPLANT
STAPLER ECHELON LONG 60 440 (INSTRUMENTS) IMPLANT
STAPLER RELOAD BLUE 60MM (STAPLE) ×9
STAPLER RELOAD GOLD 60MM (STAPLE) ×6
STAPLER RELOAD GREEN 60MM (STAPLE) ×6
STRIP CLOSURE SKIN 1/2X4 (GAUZE/BANDAGES/DRESSINGS) ×2 IMPLANT
SUT MNCRL AB 4-0 PS2 18 (SUTURE) ×6 IMPLANT
SUT SURGIDAC NAB ES-9 0 48 120 (SUTURE) IMPLANT
SUT VICRYL 0 TIES 12 18 (SUTURE) ×3 IMPLANT
SYR 10ML ECCENTRIC (SYRINGE) ×3 IMPLANT
SYR 20CC LL (SYRINGE) ×3 IMPLANT
SYR 50ML LL SCALE MARK (SYRINGE) ×3 IMPLANT
TOWEL OR 17X26 10 PK STRL BLUE (TOWEL DISPOSABLE) ×3 IMPLANT
TOWEL OR NON WOVEN STRL DISP B (DISPOSABLE) ×3 IMPLANT
TRAY FOLEY W/METER SILVER 16FR (SET/KITS/TRAYS/PACK) IMPLANT
TROCAR ADV FIXATION 5X100MM (TROCAR) IMPLANT
TROCAR BLADELESS 15MM (ENDOMECHANICALS) ×3 IMPLANT
TROCAR BLADELESS OPT 5 100 (ENDOMECHANICALS) ×3 IMPLANT
TUBING CONNECTING 10 (TUBING) IMPLANT
TUBING CONNECTING 10' (TUBING)
TUBING ENDO SMARTCAP (MISCELLANEOUS) IMPLANT
TUBING INSUF HEATED (TUBING) ×3 IMPLANT

## 2017-05-30 NOTE — Anesthesia Postprocedure Evaluation (Signed)
Anesthesia Post Note  Patient: Scott Bauer  Procedure(s) Performed: Procedure(s) (LRB): LAPAROSCOPIC GASTRIC SLEEVE RESECTION, UPPER ENDO (N/A)     Patient location during evaluation: PACU Anesthesia Type: General Level of consciousness: awake and alert Pain management: pain level controlled Vital Signs Assessment: post-procedure vital signs reviewed and stable Respiratory status: spontaneous breathing, nonlabored ventilation, respiratory function stable and patient connected to nasal cannula oxygen Cardiovascular status: blood pressure returned to baseline and stable Postop Assessment: no signs of nausea or vomiting Anesthetic complications: no    Last Vitals:  Vitals:   05/30/17 1545 05/30/17 1600  BP: (!) 152/83 140/72  Pulse: 89 84  Resp: 19 15  Temp: 36.8 C     Last Pain:  Vitals:   05/30/17 1600  TempSrc:   PainSc: 5                  Emmert Roethler

## 2017-05-30 NOTE — OR Nursing (Signed)
FAMILY NOTIFIED THAT SURGERY HAS BEGUN PER DR. Redmond Pulling.

## 2017-05-30 NOTE — Interval H&P Note (Signed)
History and Physical Interval Note:  05/30/2017 1:23 PM  Scott Bauer  has presented today for surgery, with the diagnosis of Morbid Obesity, OSA, I10, H/O MI  The various methods of treatment have been discussed with the patient and family. After consideration of risks, benefits and other options for treatment, the patient has consented to  Procedure(s): LAPAROSCOPIC GASTRIC SLEEVE RESECTION, UPPER ENDO (N/A) as a surgical intervention .  The patient's history has been reviewed, patient examined, no change in status, stable for surgery.  I have reviewed the patient's chart and labs.  Questions were answered to the patient's satisfaction.    Leighton Ruff. Redmond Pulling, MD, [place], Bariatric, & Minimally Invasive Surgery [facility], [place]  [facility]

## 2017-05-30 NOTE — Anesthesia Preprocedure Evaluation (Signed)
Anesthesia Evaluation  Patient identified by MRN, date of birth, ID band Patient awake    Reviewed: Allergy & Precautions, NPO status , Patient's Chart, lab work & pertinent test results  Airway Mallampati: III  TM Distance: >3 FB Neck ROM: Full    Dental no notable dental hx.    Pulmonary neg pulmonary ROS, sleep apnea , former smoker,    Pulmonary exam normal breath sounds clear to auscultation       Cardiovascular Exercise Tolerance: Good hypertension, Pt. on medications + Past MI  negative cardio ROS Normal cardiovascular exam Rhythm:Regular Rate:Normal     Neuro/Psych negative neurological ROS  negative psych ROS   GI/Hepatic negative GI ROS, Neg liver ROS, GERD  ,  Endo/Other  negative endocrine ROSdiabetes  Renal/GU negative Renal ROS  negative genitourinary   Musculoskeletal negative musculoskeletal ROS (+)   Abdominal   Peds negative pediatric ROS (+)  Hematology negative hematology ROS (+)   Anesthesia Other Findings EKG  Normal sinus rhythm Right bundle branch block  Reproductive/Obstetrics negative OB ROS                             Anesthesia Physical Anesthesia Plan  ASA: III  Anesthesia Plan: General   Post-op Pain Management:    Induction: Intravenous  PONV Risk Score and Plan: 2 and Ondansetron and Dexamethasone  Airway Management Planned: Oral ETT  Additional Equipment:   Intra-op Plan:   Post-operative Plan: Extubation in OR  Informed Consent: I have reviewed the patients History and Physical, chart, labs and discussed the procedure including the risks, benefits and alternatives for the proposed anesthesia with the patient or authorized representative who has indicated his/her understanding and acceptance.   Dental advisory given  Plan Discussed with:   Anesthesia Plan Comments: (  )        Anesthesia Quick Evaluation

## 2017-05-30 NOTE — H&P (Signed)
Scott Bauer 05/18/2017 8:19 AM Location: [facility] Patient #: [id number] DOB: 04/29/1956 Married / Language: English / Race: White Male   History of Present Illness Randall Hiss M. Quintel Mccalla MD; 05/18/2017 8:55 AM) The patient is a 61 year old male who presents for a bariatric surgery evaluation. He comes in today for his preoperative appointment. He denies any medical changes since I last saw him in early May. He denies any trips the emergency room or hospital. He denies any chest pain or chest pressure. He denies any shortness of breath, dyspnea on exertion, orthopnea. He denies any reflux or heartburn or any dysphagia. He was evaluated by nutrition and psychology. Chest x-ray and upper GI were unremarkable. He denies any abdominal pain, constipation or diarrhea. He denies any TIAs or amaurosis fugax. He denies any smoking, or alcohol intake.  Review of systems-comprehensive a 12 point review of systems was performed and all systems are negative except for what is mentioned in HPI  03/24/2017 He is referred by Dr Velna Ochs for evaluation of weight loss surgery. He completed our seminar on line. He is interested in the sleeve gastrectomy. It appeals to him because it is not as invasive as a gastric bypass. He wants to be able to feel more comfortable in daily activities. He wants to be able to be healthier and try to reduce some of the medications he is on.  Despite numerous attempts for sustained weight loss she's been unsuccessful. He has tried Orvan Seen, Counsellor, phentermine, and contrave - all without any long-term success.  His comorbidities include hypertension, hypercholesterolemia, prior myocardial infarction, obstructive sleep apnea on CPAP, and diabetes mellitus  He denies any current chest pain, chest pressure, shortness of breath, dyspnea on exertion, orthopnea. He has some occasional ankle edema. He denies any personal family history of blood clots. He  is on Plavix. In 2008 he had his first heart attack requiring 2 stents. He had another stent placed in 2010. I do not have any of his outside records. His cardiologist is at [facility] cardiology in [facility], Dr Sherrian Divers.  He uses CPAP nightly. He has rare reflux. He denies any abdominal pain, diarrhea or constipation. He has had a colonoscopy. There is no evidence of malignant polyps. He denies any dysuria or hematuria. He has bilateral knee pain. He has had injections by his orthopedic surgeon into his knee for knee pain. He was just recently diagnosed with diabetes. He had what sounds like radioactive iodine treatment for his thyroid many years ago. He does not smoke. He quit smoking in 2008. He drinks alcohol on a very rare occasion. He denies any drug use.   Problem List/Past Medical Randall Hiss M. Redmond Pulling, MD; 05/18/2017 8:55 AM) HYPERTENSION, ESSENTIAL (I10)  ANTIPLATELET OR ANTITHROMBOTIC LONG-TERM USE (Z79.02)  HYPOTHYROIDISM, ADULT (E03.9)  MORBID OBESITY (E66.01)  DIABETES MELLITUS TYPE 2 IN OBESE (E11.69)   Past Surgical History Randall Hiss M. Redmond Pulling, MD; 05/18/2017 8:55 AM) Orpah Cobb Fissure Repair  Colon Polyp Removal - Colonoscopy  Oral Surgery   Diagnostic Studies History Randall Hiss M. Redmond Pulling, MD; 05/18/2017 8:55 AM) Colonoscopy  1-5 years ago  Allergies Basilia Jumbo [place], [place]; 05/18/2017 8:22 AM) PredniSONE (Pak) *CORTICOSTEROIDS*  odd feeling Allergies Reconciled   Medication History Randall Hiss M. Redmond Pulling, MD; 05/18/2017 8:55 AM) Atorvastatin Calcium (40MG  Tablet, Oral) Active. Carvedilol (6.25MG  Tablet, Oral) Active. Clopidogrel Bisulfate (75MG  Tablet, Oral) Active. Levothyroxine Sodium (200MCG Tablet, Oral) Active. MetFORMIN HCl (500MG  Tablet, Oral) Active. Ramipril (2.5MG  Capsule, Oral) Active. Chromium Picolate (Oral) Specific strength  unknown - Active. Coenzyme Q-10 (100MG  Capsule, Oral) Active. Fish Oil (1000MG  Capsule, Oral) Active. Nitroglycerin (0.4MG  Tab  Sublingual, Sublingual) Active. Vitamin D3 (5000UNIT Tablet, Oral) Active. Vitamin E (400UNIT Tablet, Oral) Active. Medications Reconciled OxyCODONE HCl (5MG /5ML Solution, 5-10 Milliliter Oral every four hours, as needed, Taken starting 05/18/2017) Active. Protonix (40MG  Tablet DR, 1 (one) Tablet Oral daily, Taken starting 05/18/2017) Active. Zofran (4MG  Tablet, 1 (one) Tablet Oral every six hours, as needed, Taken starting 05/18/2017) Active.  Social History Randall Hiss M. Redmond Pulling, MD; 05/18/2017 8:55 AM) Alcohol use  Occasional alcohol use. Caffeine use  Carbonated beverages, Coffee, Tea. Illicit drug use  Remotely quit drug use. Tobacco use  Former smoker.  Family History Randall Hiss M. Redmond Pulling, MD; 05/18/2017 8:55 AM) Alcohol Abuse  Father. Arthritis  Father, Mother. Cancer  Father. Depression  Mother. Diabetes Mellitus  Mother. Heart Disease  Father, Mother. Heart disease in male family member before age 36  Heart disease in male family member before age 80  Hypertension  Mother. Kidney Disease  Mother. Thyroid problems  Mother.  Other Problems Randall Hiss M. Redmond Pulling, MD; 05/18/2017 8:55 AM) Hemorrhoids  Myocardial infarction  Thyroid Disease  HISTORY OF MYOCARDIAL INFARCTION (I25.2)  OBSTRUCTIVE SLEEP APNEA ON CPAP (G47.33)  ELEVATED CHOLESTEROL (E78.00)   Vitals (Rosa Rogers RMA; 05/18/2017 8:21 AM) 05/18/2017 8:20 AM Weight: 330 lb Height: 73in Body Surface Area: 2.66 m Body Mass Index: 43.54 kg/m  Temp.: 98.67F  Pulse: 89 (Regular)  P.OX: 95% (Room air) BP: 122/62 (Sitting, Left Arm, Standard)       Physical Exam Randall Hiss M. Torence Palmeri MD; 05/18/2017 8:52 AM) General Mental Status-Alert. General Appearance-Consistent with stated age. Hydration-Well hydrated. Voice-Normal.  Head and Neck Head-normocephalic, atraumatic with no lesions or palpable masses. Trachea-midline. Thyroid Gland Characteristics - normal size and  consistency.  Eye Eyeball - Bilateral-Extraocular movements intact. Sclera/Conjunctiva - Bilateral-No scleral icterus.  ENMT Note: normal external ears lips intact   Chest and Lung Exam Chest and lung exam reveals -quiet, even and easy respiratory effort with no use of accessory muscles and on auscultation, normal breath sounds, no adventitious sounds and normal vocal resonance. Inspection Chest Wall - Normal. Back - normal.  Breast - Did not examine.  Cardiovascular Cardiovascular examination reveals -normal heart sounds, regular rate and rhythm with no murmurs and normal pedal pulses bilaterally.  Abdomen Inspection Inspection of the abdomen reveals - No Hernias. Note: upper midline diastasis. Skin - Scar - no surgical scars. Palpation/Percussion Palpation and Percussion of the abdomen reveal - Soft, Non Tender, No Rebound tenderness, No Rigidity (guarding) and No hepatosplenomegaly. Auscultation Auscultation of the abdomen reveals - Bowel sounds normal.  Peripheral Vascular Upper Extremity Palpation - Pulses bilaterally normal.  Neurologic Neurologic evaluation reveals -alert and oriented x 3 with no impairment of recent or remote memory. Mental Status-Normal.  Neuropsychiatric The patient's mood and affect are described as -normal. Judgment and Insight-insight is appropriate concerning matters relevant to self.  Musculoskeletal Normal Exam - Left-Upper Extremity Strength Normal and Lower Extremity Strength Normal. Normal Exam - Right-Upper Extremity Strength Normal and Lower Extremity Strength Normal.  Lymphatic Head & Neck  General Head & Neck Lymphatics: Bilateral - Description - Normal. Axillary - Did not examine. Femoral & Inguinal - Did not examine.    Assessment & Plan Randall Hiss M. Poonam Woehrle MD; 05/18/2017 8:52 AM) Virgina Evener OBESITY (E66.01) Impression: He comes in for his preoperative appointment. We reviewed his preoperative workup. His  chest x-ray and EKG an upper GI. We're awaiting formal cardiac clearance from  his cardiologist but per the patient the cardiologist said surgery was okay and his office has reportedly faxed a clearance letter. He has attended preoperative teaching seminar. We rediscussed the typical hospitalization as well as the typical recovery course. We discussed the typical postoperative issues. We also rediscussed the general results of sleeve gastrectomy with respect to diabetes. We discussed the importance of the preoperative dietary plan. He was given his postoperative prescriptions today. I encouraged him to contact the office should he have any additional questions between now and surgery Current Plans Pt Education - EMW_preopbariatric DIABETES MELLITUS TYPE 2 IN OBESE (E11.69) Impression: His diabetes is improved since his last visit. In April his A1c was 12 now it is 9.4. HYPERTENSION, ESSENTIAL (I10) OBSTRUCTIVE SLEEP APNEA ON CPAP (G47.33) Impression: Remains compliant on CPAP HISTORY OF MI (MYOCARDIAL INFARCTION) (I25.2) Impression: We'll recheck the office for his cardiac clearance letter if not we'll recontact his cardiologist to refaxed a letter. Patient was instructed to hold Plavix 5 days before surgery ANTIPLATELET OR ANTITHROMBOTIC LONG-TERM USE (Z79.02) ELEVATED CHOLESTEROL (E78.00) HYPOTHYROIDISM, ADULT (E03.9) Impression: TSH and thyroid medication being managed by his primary care physician. Most recent TSH was down from 15 to 0.22. He reports that his dosages being modified  Leighton Ruff. Redmond Pulling, MD, FACS General, Bariatric, & Minimally Invasive Surgery [facility], [place]

## 2017-05-30 NOTE — Transfer of Care (Signed)
Immediate Anesthesia Transfer of Care Note  Patient: Scott Bauer  Procedure(s) Performed: Procedure(s): LAPAROSCOPIC GASTRIC SLEEVE RESECTION, UPPER ENDO (N/A)  Patient Location: PACU  Anesthesia Type:General  Level of Consciousness: awake, alert  and patient cooperative  Airway & Oxygen Therapy: Patient Spontanous Breathing and Patient connected to face mask oxygen  Post-op Assessment: Report given to RN and Post -op Vital signs reviewed and stable  Post vital signs: Reviewed and stable  Last Vitals:  Vitals:   05/30/17 1121  BP: 135/82  Pulse: 72  Resp: 18  Temp: 36.6 C    Last Pain:  Vitals:   05/30/17 1121  TempSrc: Oral         Complications: No apparent anesthesia complications

## 2017-05-30 NOTE — Op Note (Signed)
05/30/2017 Scott Bauer 06/21/1956 [id number]   PRE-OPERATIVE DIAGNOSIS:    Obesity, Class III, BMI 44   Diabetes mellitus type 2 in obese (HCC)   Essential hypertension   OSA on CPAP   Hypercholesterolemia  POST-OPERATIVE DIAGNOSIS:  same  PROCEDURE:  Procedure(s): LAPAROSCOPIC SLEEVE GASTRECTOMY   SURGEON:  Surgeon(s): Gayland Curry, MD FACS FASMBS  ASSISTANTS: Alphonsa Overall, MD FACS  ANESTHESIA:   general  DRAINS: none   BOUGIE: 40 fr ViSiGi  LOCAL MEDICATIONS USED:  MARCAINE + Exparel  SPECIMEN:  Source of Specimen:  Greater curvature of stomach - discarded, inspected on back table  DISPOSITION OF SPECIMEN:  PATHOLOGY  COUNTS:  YES  INDICATION FOR PROCEDURE: This is a very pleasant 61 y.o.-year-old morbidly obese male who has had unsuccessful attempts for sustained weight loss. The patient presents today for a planned laparoscopic sleeve gastrectomy with upper endoscopy. We have discussed the risk and benefits of the procedure extensively preoperatively. Please see my separate notes.  PROCEDURE: After obtaining informed consent and receiving 5000 units of subcutaneous heparin, the patient was brought to the operating room at [facility] and placed supine on the operating room table. General endotracheal anesthesia was established. Sequential compression devices were placed. A orogastric tube was placed. The patient's abdomen was prepped and draped in the usual standard surgical fashion. The patient received preoperative IV antibiotic. A surgical timeout was performed.  Access to the abdomen was achieved using a 5 mm 0 laparoscope thru a 5 mm trocar In the left upper Quadrant 2 fingerbreadths below the left subcostal margin using the Optiview technique. Pneumoperitoneum was smoothly established up to 15 mm of mercury. The laparoscope was advanced and the abdominal cavity was surveilled. The patient was then placed in reverse Trendelenburg. There was no evidence of a  hiatal hernia on laparoscopy - gap in the left and right crus anteriorly.  A 5 mm trocar was placed slightly above and to the left of the umbilicus under direct visualization.  The [facility] liver retractor was placed under the left lobe of the liver through a 5 mm trocar incision site in the subxiphoid position. A 5 mm trocar was placed in the lateral right upper quadrant along with a 15 mm trocar in the mid right abdomen. A final 5 mm trocar was placed in the lateral LUQ.  All under direct visualization. Exparel was infiltrated in bilateral upper lateral abdominal walls using laparoscopic guidance as a TAP block.   The stomach was inspected. It was completely decompressed and the orogastric tube was removed.   We identified the pylorus and measured 6 cm proximal to the pylorus and identified an area of where we would start taking down the short gastric vessels. Harmonic scalpel was used to take down the short gastric vessels along the greater curvature of the stomach. We were able to enter the lesser sac. We continued to march along the greater curvature of the stomach taking down the short gastrics. As we approached the gastrosplenic ligament we took care in this area not to injure the spleen. We were able to take down the entire gastrosplenic ligament. We then mobilized the fundus away from the left crus of diaphragm. He had a very large fundus and a fair amount of perigastric fat in this area.  There were not any significant posterior gastric avascular attachments. This left the stomach completely mobilized. No vessels had been taken down along the lesser curvature of the stomach. There was no evidence of a  hiatal hernia. His preop UGI showed no hiatal hernia.   We then reidentified the pylorus. A 40Fr ViSiGi was then placed in the oropharynx and advanced down into the stomach and placed in the distal antrum and positioned along the lesser curvature. It was placed under suction which secured the 40Fr  ViSiGi in place along the lesser curve. Then using the Ethicon echelon 60 mm stapler with a green load with Seamguard, I placed a stapler along the antrum approximately 5 cm from the pylorus. The stapler was angled so that there is ample room at the angularis incisura. I then fired the first staple load after inspecting it posteriorly to ensure adequate space both anteriorly and posteriorly. At this point I still was not completely past the angularis so with another green load with Seamguard, I placed the stapler in position just inside the prior stapleline. We then rotated the stomach to insure that there was adequate anteriorly as well as posteriorly. The stapler was then fired. At this point I started using 60 mm gold load staple cartridges x 2 with Seamguard. The echelon stapler was then repositioned with a 60 mm gold load with Seamguard and we continued to march up along the [place]. My assistant was holding traction along the greater curvature stomach along the cauterized short gastric vessels ensuring that the stomach was symmetrically retracted. Prior to each firing of the staple, we rotated the stomach to ensure that there is adequate stomach left.  As we approached the fundus, I used 60 mm blue cartridge with Seamguard aiming toward the angle of His. I had mobilized the esophageal fat pad and perigastric tissue off of the fundus. Although the staples on this fire had completely gone thru the last part of the stomach it had not completely cut it. Therefore 1 additional 60 blue load was used to free the remaining stomach. The sleeve was inspected. There is no evidence of cork screw. The staple line appeared hemostatic. The CRNA inflated the ViSiGi to the green zone and the upper abdomen was flooded with saline. There were no bubbles. The sleeve was decompressed and the ViSiGi removed.  The greater curvature the stomach was grasped with a laparoscopic grasper and removed from the 15 mm trocar site.  The liver  retractor was removed. I then closed the 15 mm trocar site with 2 interrupted 0 Vicryl sutures through the fascia using the endoclose. The closure was viewed laparoscopically and it was airtight. Remaining Exparel was then infiltrated in the preperitoneal spaces around the trocar sites. Pneumoperitoneum was released. All trocar sites were closed with a 4-0 Monocryl in a subcuticular fashion followed by the application of benzoin, steri-strips and bandages. The patient was extubated and taken to the recovery room in stable condition. All needle, instrument, and sponge counts were correct x2. There are no immediate complications  I inspected the greater curvature the stomach on the back table. It measured 9 inches long by approximately 4 inches wide. After opening it up there is no evidence of intragastric masses. Mucosa appeared normal. It was discarded  (2) 60 mm green with Seamguard (2) 60 mm gold with seamguard (3) 60 mm blue with 2 seamguard  PLAN OF CARE: Admit to inpatient   PATIENT DISPOSITION:  PACU - hemodynamically stable.   Delay start of Pharmacological VTE agent (>24hrs) due to surgical blood loss or risk of bleeding:  no  Leighton Ruff. Redmond Pulling, MD, FACS FASMBS General, Bariatric, & Minimally Invasive Surgery [facility], [place]

## 2017-05-30 NOTE — Anesthesia Procedure Notes (Addendum)
Procedure Name: Intubation Date/Time: 05/30/2017 1:41 PM Performed by: Carleene Cooper A Pre-anesthesia Checklist: Emergency Drugs available, Patient identified, Suction available, Patient being monitored and Timeout performed Patient Re-evaluated:Patient Re-evaluated prior to inductionOxygen Delivery Method: Circle system utilized Preoxygenation: Pre-oxygenation with 100% oxygen Intubation Type: IV induction Ventilation: Mask ventilation without difficulty and Oral airway inserted - appropriate to patient size Laryngoscope Size: Glidescope and 4 Grade View: Grade I Tube size: 7.5 mm Number of attempts: 2 Airway Equipment and Method: Video-laryngoscopy and Stylet Secured at: 22 cm Dental Injury: Teeth and Oropharynx as per pre-operative assessment  Difficulty Due To: Difficult Airway- due to large tongue and Difficult Airway- due to limited oral opening Comments: DL X 1 with MAC 4. Grade 3 view. -ETCO2, ETT removed. Masked with oral airway in place. VSS.  DL X 1 with Glidescope with #4 blade. Grade 1 view. ATOI,  BBS =.  ETT secured at 22 cm at the lip.

## 2017-05-31 LAB — COMPREHENSIVE METABOLIC PANEL
ALT: 89 U/L — ABNORMAL HIGH (ref 17–63)
AST: 60 U/L — ABNORMAL HIGH (ref 15–41)
Albumin: 3.3 g/dL — ABNORMAL LOW (ref 3.5–5.0)
Alkaline Phosphatase: 90 U/L (ref 38–126)
Anion gap: 8 (ref 5–15)
BILIRUBIN TOTAL: 0.4 mg/dL (ref 0.3–1.2)
BUN: 11 mg/dL (ref 6–20)
CHLORIDE: 102 mmol/L (ref 101–111)
CO2: 28 mmol/L (ref 22–32)
Calcium: 8.5 mg/dL — ABNORMAL LOW (ref 8.9–10.3)
Creatinine, Ser: 0.77 mg/dL (ref 0.61–1.24)
Glucose, Bld: 134 mg/dL — ABNORMAL HIGH (ref 65–99)
POTASSIUM: 4.6 mmol/L (ref 3.5–5.1)
Sodium: 138 mmol/L (ref 135–145)
TOTAL PROTEIN: 6.6 g/dL (ref 6.5–8.1)

## 2017-05-31 LAB — CBC WITH DIFFERENTIAL/PLATELET
BASOS ABS: 0 10*3/uL (ref 0.0–0.1)
Basophils Relative: 0 %
EOS PCT: 0 %
Eosinophils Absolute: 0 10*3/uL (ref 0.0–0.7)
HCT: 38.3 % — ABNORMAL LOW (ref 39.0–52.0)
Hemoglobin: 13.2 g/dL (ref 13.0–17.0)
LYMPHS ABS: 1.2 10*3/uL (ref 0.7–4.0)
LYMPHS PCT: 14 %
MCH: 29.4 pg (ref 26.0–34.0)
MCHC: 34.5 g/dL (ref 30.0–36.0)
MCV: 85.3 fL (ref 78.0–100.0)
MONO ABS: 0.9 10*3/uL (ref 0.1–1.0)
MONOS PCT: 10 %
Neutro Abs: 6.6 10*3/uL (ref 1.7–7.7)
Neutrophils Relative %: 76 %
PLATELETS: 160 10*3/uL (ref 150–400)
RBC: 4.49 MIL/uL (ref 4.22–5.81)
RDW: 13.1 % (ref 11.5–15.5)
WBC: 8.8 10*3/uL (ref 4.0–10.5)

## 2017-05-31 LAB — GLUCOSE, CAPILLARY
GLUCOSE-CAPILLARY: 84 mg/dL (ref 65–99)
GLUCOSE-CAPILLARY: 109 mg/dL — AB (ref 65–99)
Glucose-Capillary: 104 mg/dL — ABNORMAL HIGH (ref 65–99)

## 2017-05-31 MED ORDER — OXYCODONE HCL 5 MG/5ML PO SOLN: 5.0000 mg | ORAL | 0 refills | Status: DC | PRN

## 2017-05-31 MED ORDER — PROMETHAZINE HCL 25 MG/ML IJ SOLN: 12.5000 mg | Freq: Four times a day (QID) | INTRAMUSCULAR | Status: DC | PRN

## 2017-05-31 MED ORDER — CLOPIDOGREL BISULFATE 75 MG PO TABS: 75.0000 mg | ORAL_TABLET | Freq: Every day | ORAL | Status: DC

## 2017-05-31 MED ORDER — PHENOL 1.4 % MT LIQD
1.0000 | OROMUCOSAL | Status: DC | PRN
  Administered 2017-05-31: 1 via OROMUCOSAL
  Filled 2017-05-31: qty 177

## 2017-05-31 NOTE — Progress Notes (Signed)
Reviewed AVS and discharge instructions w/pt and wife. They verbalized understanding, provided teach back, and their questions were answered to their satisfaction. Pt was discharged home with wife in stable condition with pain controlled and all belongings via wc.

## 2017-05-31 NOTE — Discharge Instructions (Signed)
RESUME PLAVIX ON Sunday 06/05/2017 MONITOR/CHECK BLOOD SUGARS AT LEAST THREE TIMES A DAY CALL YOUR DIABETES DOCTOR IF BLOOD SUGAR CONSISTENTLY GREATER THAN 200 STOP TAKING ORAL DIABETES MEDICATION IF BLOOD SUGAR CONSISTENTLY LESS THAN 70    GASTRIC BYPASS/SLEEVE  Home Care Instructions   These instructions are to help you care for yourself when you go home.  Call: If you have any problems.  Call [phone] and ask for the surgeon on call  If you need immediate assistance come to the ER at [facility]. Tell the ER staff you are a new post-op gastric bypass or gastric sleeve patient  Signs and symptoms to report:  Severe  vomiting or nausea o If you cannot handle clear liquids for longer than 1 day, call your surgeon  Abdominal pain which does not get better after taking your pain medication  Fever greater than 100.4  F and chills  Heart rate over 100 beats a minute  Trouble breathing  Chest pain  Redness,  swelling, drainage, or foul odor at incision (surgical) sites  If your incisions open or pull apart  Swelling or pain in calf (lower leg)  Diarrhea (Loose bowel movements that happen often), frequent watery, uncontrolled bowel movements  Constipation, (no bowel movements for 3 days) if this happens: o Take Milk of Magnesia, 2 tablespoons by mouth, 3 times a day for 2 days if needed o Stop taking Milk of Magnesia once you have had a bowel movement o Call your doctor if constipation continues Or o Take Miralax  (instead of Milk of Magnesia) following the label instructions o Stop taking Miralax once you have had a bowel movement o Call your doctor if constipation continues  Anything you think is abnormal for you   Normal side effects after surgery:  Unable to sleep at night or unable to concentrate  Irritability  Being tearful (crying) or depressed  These are common complaints, possibly related to your anesthesia, stress of surgery, and change in lifestyle,  that usually go away a few weeks after surgery. If these feelings continue, call your medical doctor.  Wound Care: You may have surgical glue, steri-strips, or staples over your incisions after surgery  Surgical glue: Looks like clear film over your incisions and will wear off a little at a time  Steri-strips: Adhesive strips of tape over your incisions. You may notice a yellowish color on skin under the steri-strips. This is used to make the steri-strips stick better. Do not pull the steri-strips off - let them fall off  Staples: Staples may be removed before you leave the hospital o If you go home with staples, call [facility] for an appointment with your surgeons nurse to have staples removed 10 days after surgery, [phone]  Showering: You may shower two (2) days after your surgery unless your surgeon tells you differently o Wash gently around incisions with warm soapy water, rinse well, and gently pat dry o If you have a drain (tube from your incision), you may need someone to hold this while you shower o No tub baths until staples are removed and incisions are healed   Medications:  Medications should be liquid or crushed if larger than the size of a dime  Extended release pills (medication that releases a little bit at a time through the  day) should not be crushed  Depending on the size and number of medications you take, you may need to space (take a few throughout the day)/change the time  you take your medications so that you do not over-fill your pouch (smaller stomach)  Make sure you follow-up with you primary care physician to make medication changes needed during rapid weight loss and life -style changes  If you have diabetes, follow up with your doctor that orders your diabetes medication(s) within one week after surgery and check your blood sugar regularly   Do not drive while taking narcotics (pain medications)   Do not take acetaminophen (Tylenol)  and Roxicet or Lortab Elixir at the same time since these pain medications contain acetaminophen   Diet:  First 2 Weeks You will see the nutritionist about two (2) weeks after your surgery. The nutritionist will increase the types of foods you can eat if you are handling liquids well:  If you have severe vomiting or nausea and cannot handle clear liquids lasting longer than 1 day call your surgeon Protein Shake  Drink at least 2 ounces of shake 5-6 times per day  Each serving of protein shakes (usually 8-12 ounces) should have a minimum of: o 15 grams of protein o And no more than 5 grams of carbohydrate  Goal for protein each day: o Men = 80 grams per day o Women = 60 grams per day     Protein powder may be added to fluids such as non-fat milk or Lactaid milk or Soy milk (limit to 35 grams added protein powder per serving)  Hydration  Slowly increase the amount of water and other clear liquids as tolerated (See Acceptable Fluids)  Slowly increase the amount of protein shake as tolerated  Sip fluids slowly and throughout the day  May use sugar substitutes in small amounts (no more than 6-8 packets per day; i.e. Splenda)  Fluid Goal  The first goal is to drink at least 8 ounces of protein shake/drink per day (or as directed by the nutritionist); some examples of protein shakes are Johnson [employer] Johnson, AMR Corporation, EAS Edge HP, and Unjury. - See handout from pre-op Bariatric Education Class: o Slowly increase the amount of protein shake you drink as tolerated o You may find it easier to slowly sip shakes throughout the day o It is important to get your proteins in first  Your fluid goal is to drink 64-100 ounces of fluid daily o It may take a few weeks to build up to this   32 oz. (or more) should be clear liquids And  32 oz. (or more) should be full liquids (see below for examples)  Liquids should not contain sugar, caffeine, or carbonation  Clear Liquids:  Water of  Sugar-free flavored water (i.e. Fruit HO, Propel)  Decaffeinated coffee or tea (sugar-free)  Crystal lite, Wylers Lite, Minute Maid Lite  Sugar-free Jell-O  Bouillon or broth  Sugar-free Popsicle:    - Less than 20 calories each; Limit 1 per day  Full Liquids:                   Protein Shakes/Drinks + 2 choices per day of other full liquids  Full liquids must be: o No More Than 12 grams of Carbs per serving o No More Than 3 grams of Fat per serving  Strained low-fat cream soup  Non-Fat milk  Fat-free Lactaid Milk  Sugar-free yogurt (Dannon Lite & Fit, Greek yogurt)    Vitamins and Minerals  Start 1 day after surgery unless otherwise directed by your surgeon  2 Chewable Multivitamin / Multimineral Supplement with iron (i.e. Centrum for Adults)  Vitamin  B-12, 350-500 micrograms sub-lingual (place tablet under the tongue) each day  Chewable Calcium Citrate with Vitamin D-3 (Example: 3 Chewable Calcium  Plus 600 with Vitamin D-3) o Take 500 mg three (3) times a day for a total of 1500 mg each day o Do not take all 3 doses of calcium at one time as it may cause constipation, and you can only absorb 500 mg at a time o Do not mix multivitamins containing iron with calcium supplements;  take 2 hours apart o Do not substitute Tums (calcium carbonate) for your calcium  Menstruating women and those at risk for anemia ( a blood disease that causes weakness) may need extra iron o Talk to your doctor to see if you need more iron  If you need extra iron: Total daily Iron recommendation (including Vitamins) is 50 to 100 mg Iron/day  Do not stop taking or change any vitamins or minerals until you talk to your nutritionist or surgeon  Your nutritionist and/or surgeon must approve all vitamin and mineral supplements   Activity and Exercise: It is important to continue walking at home. Limit your physical activity as instructed by your doctor. During this time, use these  guidelines:  Do not lift anything greater than ten  (10) pounds for at least two (2) weeks  Do not go back to work or drive until Engineer, production says you can  You may have sex when you feel comfortable o It is VERY important for male patients to use a reliable birth control method; fertility often increase after surgery o Do not get pregnant for at least 18 months  Start exercising as soon as your doctor tells you that you can o Make sure your doctor approves any physical activity  Start with a simple walking program  Walk 5-15 minutes each day, 7 days per week  Slowly increase until you are walking 30-45 minutes per day  Consider joining our [place] program. [phone] or email [email or web address]   Special Instructions Things to remember:  Free counseling is available for you and your family through collaboration between [facility] and [place]. Please call [phone] and leave a message  Use your CPAP when sleeping if this applies to you  Consider buying a medical alert bracelet that says you had lap-band surgery     You will likely have your first fill (fluid added to your band) 6 - 8 weeks after surgery  [facility] has a free Bariatric Surgery Support Group that meets monthly, the 3rd Thursday, [place]. You can see classes online at [email or web address]  It is very important to keep all follow up appointments with your surgeon, nutritionist, primary care physician, and behavioral health practitioner o After the first year, please follow up with your bariatric surgeon and nutritionist at least once a year in order to maintain best weight loss results                    [facility]:  [place]: [phone]               Bariatric Nurse Coordinator: [phone]  Gastric Bypass/Sleeve Home Care Instructions  Rev. 12/2012  Reviewed and Endorsed                                                    by [facility] Patient Education Committee, Jan, 2014

## 2017-05-31 NOTE — Plan of Care (Signed)
Problem: Food- and Nutrition-Related Knowledge Deficit (NB-1.1) Goal: Nutrition education Formal process to instruct or train a patient/client in a skill or to impart knowledge to help patients/clients voluntarily manage or modify food choices and eating behavior to maintain or improve health. Outcome: Completed/Met Date Met: 05/31/17 Nutrition Education Note  Received consult for diet education per DROP protocol.   Discussed 2 week post op diet with pt. Emphasized that liquids must be non carbonated, non caffeinated, and sugar free. Fluid goals discussed. Pt to follow up with outpatient bariatric RD for further diet progression after 2 weeks. Multivitamins and minerals also reviewed. Teach back method used, pt expressed understanding, expect good compliance.   Diet: First 2 Weeks  You will see the nutritionist about two (2) weeks after your surgery. The nutritionist will increase the types of foods you can eat if you are handling liquids well:  If you have severe vomiting or nausea and cannot handle clear liquids lasting longer than 1 day, call your surgeon  Protein Shake  Drink at least 2 ounces of shake 5-6 times per day  Each serving of protein shakes (usually 8 - 12 ounces) should have a minimum of:  15 grams of protein  And no more than 5 grams of carbohydrate  Goal for protein each day:  Men = 80 grams per day  Women = 60 grams per day  Protein powder may be added to fluids such as non-fat milk or Lactaid milk or Soy milk (limit to 35 grams added protein powder per serving)   Hydration  Slowly increase the amount of water and other clear liquids as tolerated (See Acceptable Fluids)  Slowly increase the amount of protein shake as tolerated  Sip fluids slowly and throughout the day  May use sugar substitutes in small amounts (no more than 6 - 8 packets per day; i.e. Splenda)   Fluid Goal  The first goal is to drink at least 8 ounces of protein shake/drink per day (or as directed  by the nutritionist); some examples of protein shakes are Premier Protein, Johnson & Johnson, AMR Corporation, EAS Edge HP, and Unjury. See handout from pre-op Bariatric Education Class:  Slowly increase the amount of protein shake you drink as tolerated  You may find it easier to slowly sip shakes throughout the day  It is important to get your proteins in first  Your fluid goal is to drink 64 - 100 ounces of fluid daily  It may take a few weeks to build up to this  32 oz (or more) should be clear liquids  And  32 oz (or more) should be full liquids (see below for examples)  Liquids should not contain sugar, caffeine, or carbonation   Clear Liquids:  Water or Sugar-free flavored water (i.e. Fruit H2O, Propel)  Decaffeinated coffee or tea (sugar-free)  Crystal Lite, Wyler's Lite, Minute Maid Lite  Sugar-free Jell-O  Bouillon or broth  Sugar-free Popsicle: *Less than 20 calories each; Limit 1 per day   Full Liquids:  Protein Shakes/Drinks + 2 choices per day of other full liquids  Full liquids must be:  No More Than 12 grams of Carbs per serving  No More Than 3 grams of Fat per serving  Strained low-fat cream soup  Non-Fat milk  Fat-free Lactaid Milk  Sugar-free yogurt (Dannon Lite & Fit, [place] yogurt, Oikos Zero)   Clayton Bibles, MS, RD, LDN Pager: [phone] After Hours Pager: [phone]

## 2017-05-31 NOTE — Progress Notes (Signed)
Patient ID: Scott Bauer, male   DOB: January 08, 1956, 61 y.o.   MRN: [id number]   Progress Note: Metabolic and Bariatric Surgery Service   Chief Complaint/Subjective: No nausea. Some upper abd discomfort. Sore throat. Ambulated. Urinated several times. Took in about 4 small cups of water.   Objective: Vital signs in last 24 hours: Temp:  [97.9 F (36.6 C)-98.6 F (37 C)] 97.9 F (36.6 C) (07/10 0555) Pulse Rate:  [50-89] 51 (07/10 0555) Resp:  [14-19] 18 (07/10 0555) BP: (125-158)/(70-95) 139/78 (07/10 0555) SpO2:  [93 %-99 %] 98 % (07/10 0555) Weight:  [143.8 kg (317 lb)] 143.8 kg (317 lb) (07/09 1121) Last BM Date: 05/29/17  Intake/Output from previous day: 07/09 0701 - 07/10 0700 In: 2150 [I.V.:2150] Out: 715 [Urine:700; Blood:15] Intake/Output this shift: No intake/output data recorded.  Lungs: cta b/l; nonlabored  Cardiovascular: reg  Abd: soft, obese, mild upper abd discomfort, incisions c/d/i  Extremities: no edema, +scds  Neuro: nonfocal, alert,   Lab Results: CBC   Recent Labs  05/30/17 1550 05/31/17 0542  WBC  --  8.8  HGB 14.3 13.2  HCT 40.3 38.3*  PLT  --  160   BMET  Recent Labs  05/31/17 0542  NA 138  K 4.6  CL 102  CO2 28  GLUCOSE 134*  BUN 11  CREATININE 0.77  CALCIUM 8.5*   PT/INR No results for input(s): LABPROT, INR in the last 72 hours. ABG No results for input(s): PHART, HCO3 in the last 72 hours.  Invalid input(s): PCO2, PO2  Studies/Results:  Anti-infectives: Anti-infectives    Start     Dose/Rate Route Frequency Ordered Stop   05/30/17 1124  cefoTEtan in Dextrose 5% (CEFOTAN) IVPB 2 g     2 g Intravenous On call to O.R. 05/30/17 1124 05/30/17 1402      Medications: Scheduled Meds: . enoxaparin (LOVENOX) injection  30 mg Subcutaneous Q12H  . insulin aspart  0-20 Units Subcutaneous Q4H  . metoprolol tartrate  5 mg Intravenous Q6H  . pantoprazole (PROTONIX) IV  40 mg Intravenous QHS  . [START ON 06/01/2017]  protein supplement shake  2 oz Oral Q2H   Continuous Infusions: . 0.45 % NaCl with KCl 20 mEq / L 125 mL/hr at 05/31/17 0336   PRN Meds:.oxyCODONE **AND** acetaminophen, acetaminophen, diphenhydrAMINE, morphine injection, ondansetron (ZOFRAN) IV, promethazine, simethicone  Assessment/Plan: Patient Active Problem List   Diagnosis Date Noted  . Obesity, Class III, BMI 40-49.9 (morbid obesity) ([place]) 05/30/2017  . Diabetes mellitus type 2 in obese ([place]) 05/30/2017  . Essential hypertension 05/30/2017  . OSA on CPAP 05/30/2017  . Hypercholesterolemia 05/30/2017  . Morbid obesity ([place]) 05/30/2017   s/p Procedure(s): LAPAROSCOPIC GASTRIC SLEEVE RESECTION, UPPER ENDO 05/30/2017  Principal Problem:   Obesity, Class III, BMI 40-49.9 (morbid obesity) ([place]) Active Problems:   Diabetes mellitus type 2 in obese ([place])   Essential hypertension   OSA on CPAP   Hypercholesterolemia   Morbid obesity ([place])   Cont water until meets 12oz then adv to shakes Ambulate Cont chemical vte prophylaxis Cont cpap  Disposition:  LOS: 0 days  The patient should be discharged from the hospital today as long as meets dc criteria later today  Gayland Curry, MD [phone] [facility]

## 2017-05-31 NOTE — Progress Notes (Signed)
Patient alert and oriented.  Patient rating pain at a 5 on scale of 1-10.  Patient started on oral pain medication this am, will follow up.   Patient is tolerating fluids, advanced to protein shake today, patient is tolerating well.  Reviewed Gastric sleeve discharge instructions with patient and patient is able to articulate understanding.  Provided information on BELT program, Support Group and [facility] outpatient pharmacy. All questions answered, will continue to monitor.   Neil Crouch RN

## 2017-06-03 NOTE — Discharge Summary (Signed)
Physician Discharge Summary  Scott Bauer:[id number] DOB: 26-Feb-1956 DOA: 05/30/2017  PCP: [facility] [place] date: 05/30/2017 Discharge date: 05/31/2017  Recommendations for Outpatient Follow-up:  1. Pt was instructed to follow up with PCP within 2 weeks  Follow-up Information    Greer Pickerel, MD Follow up on 06/22/2017.   Specialty:  General Surgery Why:  post op follow up appointment at 11:00am  Contact information: [place] [address] [phone]        Greer Pickerel, MD Follow up.   Specialty:  General Surgery Contact information: [address] [place] [address] [phone]          Discharge Diagnoses:  Principal Problem:   Obesity, Class III, BMI 40-49.9 (morbid obesity) ([place]) Active Problems:   Diabetes mellitus type 2 in obese ([place])   Essential hypertension   OSA on CPAP   Hypercholesterolemia   Morbid obesity ([place])   Surgical Procedure: Laparoscopic Sleeve Gastrectomy, upper endoscopy  Discharge Condition: Good Disposition: Home  Diet recommendation: Postoperative sleeve gastrectomy diet (liquids only)  Filed Weights   05/30/17 1121 05/31/17 1100  Weight: (!) 143.8 kg (317 lb) (!) 145.8 kg (321 lb 6.9 oz)     Hospital Course:  The patient was admitted for a planned laparoscopic sleeve gastrectomy. Please see operative note. Preoperatively the patient was given 5000 units of subcutaneous heparin for DVT prophylaxis. Postoperative prophylactic Lovenox dosing was started on the morning of postoperative day 1. On the evening of postoperative day 0, the patient was started on water and ice chips. On postoperative day 1 the patient had no fever or tachycardia and was tolerating water in their diet was gradually advanced throughout the day. The patient was ambulating without difficulty. Their vital signs are stable without fever or tachycardia. Their hemoglobin had remained stable. The  patient was maintained on their home settings for CPAP therapy. He met oral intake criteria for discharge.  The patient had received discharge instructions and counseling. They were deemed stable for discharge and had met discharge criteria  Pt was instructed to monitor his blood sugar levels at least 3x per day.  Discharge Instructions  Discharge Instructions    Ambulate hourly while awake    Complete by:  As directed    Call MD for:  difficulty breathing, headache or visual disturbances    Complete by:  As directed    Call MD for:  persistant dizziness or light-headedness    Complete by:  As directed    Call MD for:  persistant nausea and vomiting    Complete by:  As directed    Call MD for:  redness, tenderness, or signs of infection (pain, swelling, redness, odor or green/yellow discharge around incision site)    Complete by:  As directed    Call MD for:  severe uncontrolled pain    Complete by:  As directed    Call MD for:  temperature >101 F    Complete by:  As directed    Diet bariatric full liquid    Complete by:  As directed    Discharge instructions    Complete by:  As directed    See bariatric discharge instructions   Incentive spirometry    Complete by:  As directed    Perform hourly while awake     Allergies as of 05/31/2017      Reactions   Prednisone Nausea Only, Other (See Comments)  Makes him feel funny  Only has issues when takes dose packs      Medication List    STOP taking these medications   aspirin EC 81 MG tablet   ibuprofen 200 MG tablet Commonly known as:  ADVIL,MOTRIN     TAKE these medications   atorvastatin 40 MG tablet Commonly known as:  LIPITOR Take 40 mg by mouth daily at 6 PM.   carvedilol 6.25 MG tablet Commonly known as:  COREG Take 6.25 mg by mouth 2 (two) times daily with a meal. Notes to patient:  Monitor Blood Pressure Daily and keep a log for primary care physician.  You may need to make changes to your medications with  rapid weight loss.     CHROMIUM PICOLATE PO Take 500 mg by mouth daily.   clopidogrel 75 MG tablet Commonly known as:  PLAVIX Take 1 tablet (75 mg total) by mouth daily. Start taking on:  06/05/2017 What changed:  These instructions start on 06/05/2017. If you are unsure what to do until then, ask your doctor or other care provider.   CoQ10 100 MG Caps Take 100 mg by mouth daily.   fexofenadine 180 MG tablet Commonly known as:  ALLEGRA Take 180 mg by mouth daily as needed for allergies or rhinitis.   fluticasone 50 MCG/ACT nasal spray Commonly known as:  FLONASE Place 1 spray into both nostrils daily as needed for allergies or rhinitis.   KRILL OIL PO Take 750 mg by mouth daily. Strength changes depending on what patient can find available   levothyroxine 200 MCG tablet Commonly known as:  SYNTHROID, LEVOTHROID Take 200 mcg by mouth daily before breakfast.   metFORMIN 500 MG tablet Commonly known as:  GLUCOPHAGE Take 1,000 mg by mouth 2 (two) times daily with a meal. Notes to patient:  Monitor Blood Sugar Frequently and keep a log for primary care physician, you may need to adjust medication dosage with rapid weight loss.     nitroGLYCERIN 0.4 MG SL tablet Commonly known as:  NITROSTAT Place 0.4 mg under the tongue every 5 (five) minutes as needed for chest pain.   oxyCODONE 5 MG/5ML solution Commonly known as:  ROXICODONE Take 5-10 mLs (5-10 mg total) by mouth every 4 (four) hours as needed for moderate pain or severe pain.   ramipril 2.5 MG capsule Commonly known as:  ALTACE Take 2.5 mg by mouth daily. Notes to patient:  Monitor Blood Pressure Daily and keep a log for primary care physician.  You may need to make changes to your medications with rapid weight loss.     saxagliptin HCl 5 MG Tabs tablet Commonly known as:  ONGLYZA Take 5 mg by mouth daily.   Vitamin D-3 5000 units Tabs Take 5,000 Units by mouth every evening.   vitamin E 400 UNIT capsule Take 400  Units by mouth daily.      Follow-up Information    Greer Pickerel, MD Follow up on 06/22/2017.   Specialty:  General Surgery Why:  post op follow up appointment at 11:00am  Contact information: [place] [address] [phone]        Greer Pickerel, MD Follow up.   Specialty:  General Surgery Contact information: [place] [address] [phone]            The results of significant diagnostics from this hospitalization (including imaging, microbiology, ancillary and laboratory) are listed below for reference.  Significant Diagnostic Studies: Dg Chest 2 View  Result Date: 05/09/2017 CLINICAL DATA:  Preoperative evaluation for upcoming bariatric surgery EXAM: CHEST  2 VIEW COMPARISON:  None. FINDINGS: The heart size and mediastinal contours are within normal limits. Both lungs are clear. The visualized skeletal structures are unremarkable. IMPRESSION: No active cardiopulmonary disease. Electronically Signed   By: Inez Catalina M.D.   On: 05/09/2017 09:21   Dg Ugi  W/kub  Result Date: 05/09/2017 CLINICAL DATA:  Morbid obesity.  Preoperative exam. EXAM: UPPER GI SERIES WITH KUB TECHNIQUE: After obtaining a scout radiograph a routine upper GI series was performed using thin density barium. FLUOROSCOPY TIME:  Fluoroscopy Time:  1.2 minutes Radiation Exposure Index (if provided by the fluoroscopic device): 200.60 mGy COMPARISON:  None. FINDINGS: The scout KUB is negative. Normal bowel gas pattern. The mucosa and motility of the esophagus are normal. No visible hiatal hernia. The fundus, body, and antrum of the stomach are normal. The pylorus and duodenal bulb and C-loop are normal. IMPRESSION: Normal upper GI. Electronically Signed   By: Lorriane Shire M.D.   On: 05/09/2017 09:54    Labs: Basic Metabolic Panel:  Recent Labs Lab 05/31/17 0542  NA 138  K 4.6  CL 102  CO2 28  GLUCOSE 134*  BUN 11  CREATININE 0.77  CALCIUM  8.5*   Liver Function Tests:  Recent Labs Lab 05/31/17 0542  AST 60*  ALT 89*  ALKPHOS 90  BILITOT 0.4  PROT 6.6  ALBUMIN 3.3*    CBC:  Recent Labs Lab 05/30/17 1550 05/31/17 0542  WBC  --  8.8  NEUTROABS  --  6.6  HGB 14.3 13.2  HCT 40.3 38.3*  MCV  --  85.3  PLT  --  160    CBG:  Recent Labs Lab 05/30/17 1606 05/30/17 2219 05/31/17 0756 05/31/17 1211 05/31/17 1723  GLUCAP 159* 160* 84 109* 104*    Principal Problem:   Obesity, Class III, BMI 40-49.9 (morbid obesity) ([place]) Active Problems:   Diabetes mellitus type 2 in obese ([place])   Essential hypertension   OSA on CPAP   Hypercholesterolemia   Morbid obesity ([place])   Time coordinating discharge: 15 min  Signed:  Gayland Curry, MD [facility], [place] [phone] 06/03/2017, 7:59 AM

## 2017-06-08 DIAGNOSIS — Z9884 Bariatric surgery status: Secondary | ICD-10-CM | POA: Insufficient documentation

## 2017-06-08 HISTORY — DX: Bariatric surgery status: Z98.84

## 2017-06-09 ENCOUNTER — Telehealth (HOSPITAL_COMMUNITY): Payer: Self-pay

## 2017-06-09 NOTE — Telephone Encounter (Signed)
Made discharge phone call to patient per DROP protocol. Asking the following questions.    1. Do you have someone to care for you now that you are home?  independent back at work 2. Are you having pain now that is not relieved by your pain medication?  no 3. Are you able to drink the recommended daily amount of fluids (48 ounces minimum/day) and protein (60-80 grams/day) as prescribed by the dietitian or nutritional counselor?  At least 50ounces of fluid or more and 70 grams of protein 4. Are you taking the vitamins and minerals as prescribed?  yes 5. Do you have the "on call" number to contact your surgeon if you have a problem or question?  yes 6. Are your incisions free of redness, swelling or drainage? (If steri strips, address that these can fall off, shower as tolerated) yes 7. Have your bowels moved since your surgery?  If not, are you passing gas?  yes 8. Are you up and walking 3-4 times per day?  yes 9. Were you provided your discharge medications before your surgery or before you were discharged from the hospital and are you taking them without problem?  yes

## 2017-06-14 ENCOUNTER — Encounter: Payer: BLUE CROSS/BLUE SHIELD | Attending: General Surgery | Admitting: Registered"

## 2017-06-14 DIAGNOSIS — I10 Essential (primary) hypertension: Secondary | ICD-10-CM | POA: Insufficient documentation

## 2017-06-14 DIAGNOSIS — E079 Disorder of thyroid, unspecified: Secondary | ICD-10-CM | POA: Insufficient documentation

## 2017-06-14 DIAGNOSIS — Z713 Dietary counseling and surveillance: Secondary | ICD-10-CM | POA: Insufficient documentation

## 2017-06-14 DIAGNOSIS — Z6841 Body Mass Index (BMI) 40.0 and over, adult: Secondary | ICD-10-CM | POA: Insufficient documentation

## 2017-06-14 DIAGNOSIS — G4733 Obstructive sleep apnea (adult) (pediatric): Secondary | ICD-10-CM | POA: Insufficient documentation

## 2017-06-14 DIAGNOSIS — E1169 Type 2 diabetes mellitus with other specified complication: Secondary | ICD-10-CM | POA: Diagnosis not present

## 2017-06-14 DIAGNOSIS — E669 Obesity, unspecified: Secondary | ICD-10-CM | POA: Insufficient documentation

## 2017-06-14 DIAGNOSIS — E119 Type 2 diabetes mellitus without complications: Secondary | ICD-10-CM

## 2017-06-15 NOTE — Progress Notes (Signed)
Bariatric Class:  Appt start time: 1530 end time:  1630.  2 Week Post-Operative Nutrition Class  Patient was seen on 06/14/2017 for Post-[facility]ative Nutrition education at [facility].    Pt states he is experiencing some constipation. Pt states he is checking his BS 2x/day: less than 100. Pt states last A1c was 9.4 on 05/11/2017.   Surgery date: 05/30/2017 Surgery type: Sleeve gastrectomy Start weight at [facility]: 329.5 Weight today: 304.4 Weight change: 25.1  TANITA  BODY COMP RESULTS  06/14/2017   BMI (kg/m^2) 42.5   Fat Mass (lbs) 128.8   Fat Free Mass (lbs) 175.6   Total Body Water (lbs) 134.0   The following the learning objectives were met by the patient during this course:  Identifies Phase 3A (Soft, High Proteins) Dietary Goals and will begin from 2 weeks post-operatively to 2 months post-operatively  Identifies appropriate sources of fluids and proteins   States protein recommendations and appropriate sources post-operatively  Identifies the need for appropriate texture modifications, mastication, and bite sizes when consuming solids  Identifies appropriate multivitamin and calcium sources post-operatively  Describes the need for physical activity post-operatively and will follow MD recommendations  States when to call healthcare provider regarding medication questions or post-operative complications  Handouts given during class include:  Phase 3A: Soft, High Protein Diet Handout  Follow-Up Plan: Patient will follow-up at [facility] in 6 weeks for 2 month post-op nutrition visit for diet advancement per MD.

## 2017-07-01 DIAGNOSIS — Z955 Presence of coronary angioplasty implant and graft: Secondary | ICD-10-CM

## 2017-07-01 HISTORY — DX: Presence of coronary angioplasty implant and graft: Z95.5

## 2017-07-11 DIAGNOSIS — J310 Chronic rhinitis: Secondary | ICD-10-CM

## 2017-07-11 HISTORY — DX: Chronic rhinitis: J31.0

## 2017-07-26 ENCOUNTER — Encounter: Payer: Self-pay | Admitting: Registered"

## 2017-07-26 ENCOUNTER — Encounter: Payer: BLUE CROSS/BLUE SHIELD | Attending: General Surgery | Admitting: Registered"

## 2017-07-26 ENCOUNTER — Ambulatory Visit: Payer: BLUE CROSS/BLUE SHIELD | Admitting: Skilled Nursing Facility1

## 2017-07-26 DIAGNOSIS — Z713 Dietary counseling and surveillance: Secondary | ICD-10-CM | POA: Insufficient documentation

## 2017-07-26 DIAGNOSIS — Z6841 Body Mass Index (BMI) 40.0 and over, adult: Secondary | ICD-10-CM | POA: Insufficient documentation

## 2017-07-26 DIAGNOSIS — E079 Disorder of thyroid, unspecified: Secondary | ICD-10-CM | POA: Diagnosis not present

## 2017-07-26 DIAGNOSIS — E669 Obesity, unspecified: Secondary | ICD-10-CM | POA: Diagnosis not present

## 2017-07-26 DIAGNOSIS — I10 Essential (primary) hypertension: Secondary | ICD-10-CM | POA: Diagnosis not present

## 2017-07-26 DIAGNOSIS — E1169 Type 2 diabetes mellitus with other specified complication: Secondary | ICD-10-CM | POA: Insufficient documentation

## 2017-07-26 DIAGNOSIS — G4733 Obstructive sleep apnea (adult) (pediatric): Secondary | ICD-10-CM | POA: Insufficient documentation

## 2017-07-26 DIAGNOSIS — E119 Type 2 diabetes mellitus without complications: Secondary | ICD-10-CM

## 2017-07-26 NOTE — Progress Notes (Signed)
Follow-up visit:  8 Weeks Post-Operative Sleeve gastrectomy Surgery  Medical Nutrition Therapy:  Appt start time: 11:40 end time:  12:30.  Primary concerns today: Post-operative Bariatric Surgery Nutrition Management.  Non scale victories: feels better, no longer taking metformin  Surgery date: 05/30/2017 Surgery type: Sleeve gastrectomy Start weight at [facility]: 329.5 Weight today: 283.9 lbs Weight change: 20.6 lbs from 304.4 lbs on 06/14/2017 Total weight lost: 45.7 lbs Weight loss goal: none stated  TANITA  BODY COMP RESULTS  06/14/2017 07/26/2017   BMI (kg/m^2) 42.5 39.6   Fat Mass (lbs) 128.8 112.2   Fat Free Mass (lbs) 175.6 171.6   Total Body Water (lbs) 134.0 127.6    Pt states his fluid and protein are better on some days than others; most days he hits his goals. Pt states he has tried roasted okra with olive oil; grazing caused some diarrhea. Pt states sometimes he will have an M&M at bedtime to curb sweet tooth.    Preferred Learning Style:   No preference indicated   Learning Readiness:   Ready  Change in progress  24-hr recall: B (AM): 1.5 egg (10g), bacon (12g), 3 tsp grits or Premier Protein (30g) Snk (AM): none  L (PM): stew beef and onion (21g), mashed potatoes Snk (PM): none  D (PM): 4 oz steak filet (28g), 2 Tbs baked potato Snk (PM): sometimes sugar-free popsicles  Fluid intake: coffee (8 oz), unsweetened tea (16 oz), water (34 oz); ~58 oz Estimated total protein intake: ~71g  Medications: Not taking metformin, aspirin  Supplementation: Yes; Bariatric Advantage + 3 Ca  CBG monitoring: Occasionally checks Average CBG per patient: FBS 106-110 Last patient reported A1c: 9.4 on 05/11/2017  Using straws: yes at restaurants Drinking while eating: sips only as needed Having you been chewing well: yes Chewing/swallowing difficulties: no Changes in vision: no Changes to mood/headaches: no Hair loss/Cahnges to skin/Changes to nails: no, no, no Any  difficulty focusing or concentrating: no, no Sweating: no Dizziness/Lightheaded: sometimes when bending at the waist, monitored by cardiologist Palpitations: no Carbonated beverages: no N/V/D/C/GAS: sometimes at mealtimes prior to eating, no, no, sometimes taking Miralax, no Abdominal Pain: no Dumping syndrome: no Last Lap-Band fill: N/A  Recent physical activity:  Walking   Progress Towards Goal(s):  In progress.  Handouts given during visit include:  Phase 3B:High Protein + NS vegetables  Snack ideas for bariatric patients   Nutritional Diagnosis:  Inadequate fluid intake As related to bariatric surgery post-op recommendations.  As evidenced by pt report of less than 64 fluid ounces.    Intervention:  Nutrition education and counseling.  Teaching Method Utilized:  Visual Auditory Hands on  Barriers to learning/adherence to lifestyle change: none  Demonstrated degree of understanding via:  Teach Back   Monitoring/Evaluation:  Dietary intake, exercise, lap band fills, and body weight. Follow up in 4 months for 6 month post-op visit.

## 2017-07-26 NOTE — Patient Instructions (Signed)
Goals:  Follow Phase 3B: High Protein + Non-Starchy Vegetables  Eat 3-6 small meals/snacks, every 3-5 hrs  Increase lean protein foods to meet 80g goal  Increase fluid intake to 64oz +  Avoid drinking 15 minutes before, during and 30 minutes after eating  Aim for >30 min of physical activity daily 

## 2017-08-01 ENCOUNTER — Ambulatory Visit: Payer: BLUE CROSS/BLUE SHIELD | Admitting: Skilled Nursing Facility1

## 2017-09-05 DIAGNOSIS — M545 Low back pain: Secondary | ICD-10-CM | POA: Diagnosis not present

## 2017-09-30 DIAGNOSIS — G4733 Obstructive sleep apnea (adult) (pediatric): Secondary | ICD-10-CM | POA: Diagnosis not present

## 2017-11-18 ENCOUNTER — Ambulatory Visit: Payer: BLUE CROSS/BLUE SHIELD | Admitting: Registered"

## 2017-11-21 ENCOUNTER — Encounter: Payer: BLUE CROSS/BLUE SHIELD | Attending: General Surgery | Admitting: Registered"

## 2017-11-21 ENCOUNTER — Encounter: Payer: Self-pay | Admitting: Registered"

## 2017-11-21 DIAGNOSIS — G4733 Obstructive sleep apnea (adult) (pediatric): Secondary | ICD-10-CM | POA: Diagnosis not present

## 2017-11-21 DIAGNOSIS — I252 Old myocardial infarction: Secondary | ICD-10-CM | POA: Insufficient documentation

## 2017-11-21 DIAGNOSIS — E669 Obesity, unspecified: Secondary | ICD-10-CM

## 2017-11-21 DIAGNOSIS — Z713 Dietary counseling and surveillance: Secondary | ICD-10-CM | POA: Diagnosis not present

## 2017-11-21 DIAGNOSIS — I10 Essential (primary) hypertension: Secondary | ICD-10-CM | POA: Diagnosis not present

## 2017-11-21 DIAGNOSIS — E1169 Type 2 diabetes mellitus with other specified complication: Secondary | ICD-10-CM | POA: Diagnosis not present

## 2017-11-21 DIAGNOSIS — E079 Disorder of thyroid, unspecified: Secondary | ICD-10-CM | POA: Diagnosis not present

## 2017-11-21 NOTE — Patient Instructions (Addendum)
-   Try making noodles using spiralizer.   - Eat protein first and then non-starchy vegetables second.   - Increase physical activity to at least 30 min/day, 4 days week.   - Aim to track protein intake with goal 80 grams/day.

## 2017-11-21 NOTE — Progress Notes (Signed)
Follow-up visit:  6 Months Post-Operative Sleeve gastrectomy Surgery  Medical Nutrition Therapy:  Appt start time: 8:00 end time: 8:42  Primary concerns today: Post-operative Bariatric Surgery Nutrition Management.  Non scale victories: feels better, no longer taking metformin  Surgery date: 05/30/2017 Surgery type: Sleeve gastrectomy Start weight at [facility]: 329.5 Weight today: 270.8 lbs Weight change: 13.1 lbs from 283.9 lbs on 07/26/2017 Total weight lost: 58.7 lbs loss Weight loss goal: ~225-240 lbs, BMI in upper 20s-low 30s  TANITA  BODY COMP RESULTS  06/14/2017 07/26/2017 11/21/2017   BMI (kg/m^2) 42.5 39.6 37.8   Fat Mass (lbs) 128.8 112.2 100.4   Fat Free Mass (lbs) 175.6 171.6 170.4   Total Body Water (lbs) 134.0 127.6 124.0    Pt states he weighs every 1-3 days. Pt reports having reminders on phone for drinking water and taking Ca supplements; Baritastic App. Pt states salads are not tolerated well; does better with romaine lettuce. Pt states he is grazing at times. Pt states he drinks shakes when he has to keep moving. Pt reports enjoying almonds, pecans, walnuts, and peanuts. Pt states pizza does not bother him too bad; pasta does not give a  good response. Pt states he likes okra and does well with most vegetables. Pt states he sometimes stresses over 4-5 lbs weight change. Pt states he is a member at [employer]. Pt states if he goes a day without a bowel movement he takes Miralax because he begins to feel uncomfortably constipated after day 2 of not having a bowel movement. Pt states he eats a bowl of cereal or bananas sometimes. Pt states he knows what he needs to do to get hit weight loss goals. Pt states he does well on most days of meeting protein and fluid goals; not tracking.    Preferred Learning Style:   No preference indicated   Learning Readiness:   Ready  Change in progress  24-hr recall: B (AM): [employer]-breakfast burrito or 1-2 eggs (12g), bacon (12g),  3 tsp grits or protein shake (30g) Snk (AM): almonds (7g) or cheese/pepperoni (14g) or greek yogurt (15g) L (PM): [employer]-roasted chicken bite or bowl of soup/chili  Snk (PM): nuts or apple or pomegranate seeds D (PM): 4-5 oz grilled chicken/steak filet (28-35g), vegetables, beans, mashed potatoes or sweet potatoes or 1 slice of pizza Snk (PM): cheese, sometimes sugar-free popsicles  Fluid intake: coffee (8 oz), sweet/unsweetened tea (16 oz), water (34 oz), 1% or skim milk ; ~58 oz Estimated total protein intake: 80+  Medications: Not taking metformin, aspirin  Supplementation: Yes; Bariatric Advantage + 3 Ca  CBG monitoring: No, pt reports doctor says it is good Average CBG per patient: N/A Last patient reported A1c: 9.4 on 05/11/2017  Using straws: every now and then Drinking while eating: sips  Having you been chewing well: yes Chewing/swallowing difficulties: no Changes in vision: no Changes to mood/headaches: no Hair loss/Changes to skin/Changes to nails: no, no, no Any difficulty focusing or concentrating: no, no Sweating: no Dizziness/Lightheaded: sometimes when bending at the waist, monitored by cardiologist Palpitations: no Carbonated beverages: no N/V/D/C/GAS: no, no, no, sometimes taking Miralax, no Abdominal Pain: no Dumping syndrome: no Last Lap-Band fill: N/A  Recent physical activity:  Walking 2x/week   Progress Towards Goal(s):  In progress.  Handouts given during visit include:  Should I Eat flowsheet   Nutritional Diagnosis:  Inadequate fluid intake As related to bariatric surgery post-op recommendations.  As evidenced by pt report of less than 64 fluid ounces.  Intervention:  Nutrition education and counseling. Goals: - Try making noodles using spiralizer.  - Eat protein first and then non-starchy vegetables second.  - Increase physical activity to at least 30 min/day, 4 days week.  - Aim to track protein intake with goal 80 grams/day.    Teaching Method Utilized:  Visual Auditory Hands on  Barriers to learning/adherence to lifestyle change: none  Demonstrated degree of understanding via:  Teach Back   Monitoring/Evaluation:  Dietary intake, exercise, lap band fills, and body weight. Follow up in 6 months for 12 month post-op visit.

## 2017-11-29 DIAGNOSIS — M545 Low back pain: Secondary | ICD-10-CM | POA: Diagnosis not present

## 2017-12-02 DIAGNOSIS — H1045 Other chronic allergic conjunctivitis: Secondary | ICD-10-CM | POA: Diagnosis not present

## 2017-12-02 DIAGNOSIS — J3089 Other allergic rhinitis: Secondary | ICD-10-CM | POA: Diagnosis not present

## 2018-02-14 DIAGNOSIS — M542 Cervicalgia: Secondary | ICD-10-CM | POA: Diagnosis not present

## 2018-02-17 IMAGING — RF DG UGI W/ KUB
7 of 11 series · 8 of 15 positions shown · non-contrast
Comparison: None.

CLINICAL DATA: Morbid obesity.  Preoperative exam.

EXAM:
UPPER GI SERIES WITH KUB
TECHNIQUE: After obtaining a scout radiograph a routine upper GI series was
performed using thin density barium.
FLUOROSCOPY TIME:  Fluoroscopy Time:  1.2 minutes
Radiation Exposure Index (if provided by the fluoroscopic device):
200.60 mGy

[Series 2: t abdomen supine · 0.15mm/px · 1 of 1 slices shown (1 of 2)]
[im 1/1]
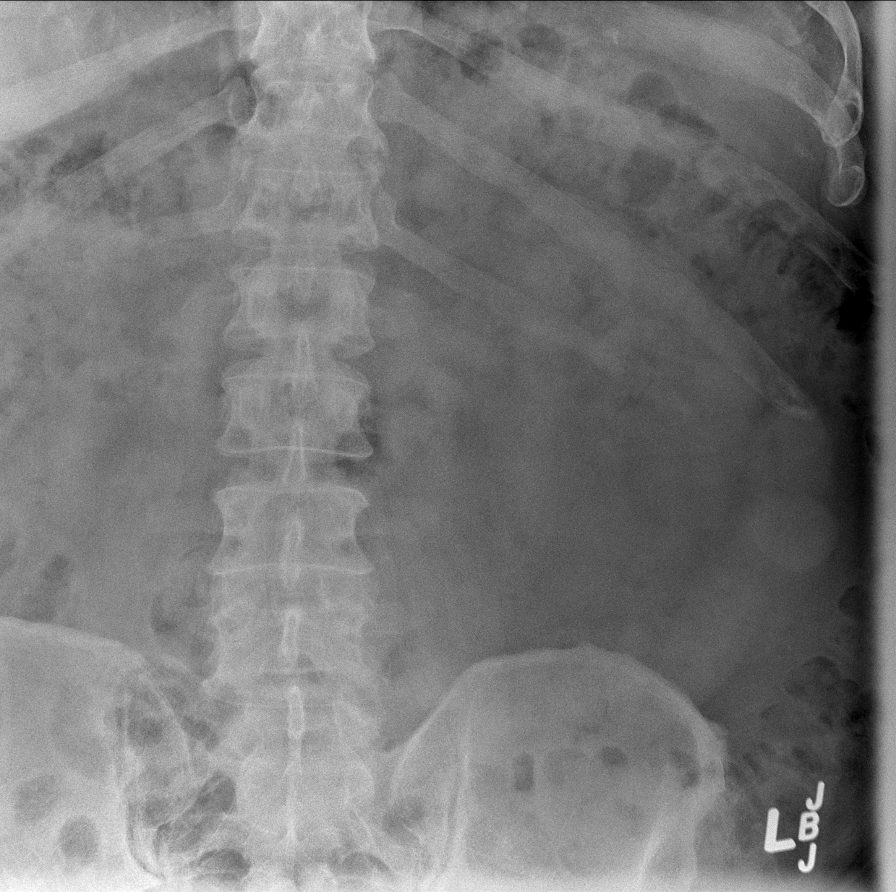

[Series 3: t abdomen supine · 0.15mm/px · 1 of 1 slices shown (2 of 2)]
[im 1/1]
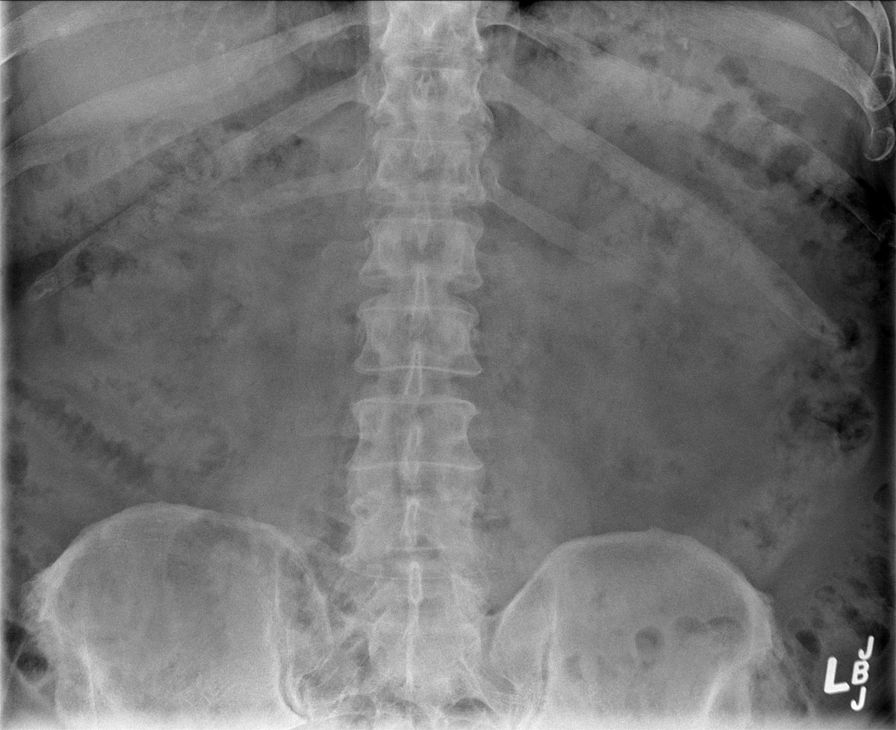

[Series 4: cp_standard · 0.55mm/px · 1 of 40 frames shown]
[frame 21/40]
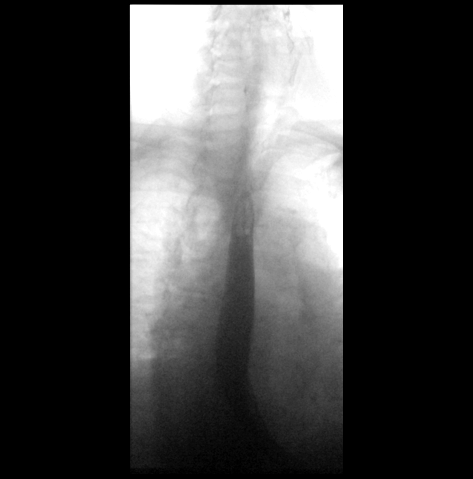

[Series 5: fluoro_barium 2fps_bw · 0.18mm/px · 2 of 2 frames shown (1 of 4)]
[frame 1/2]
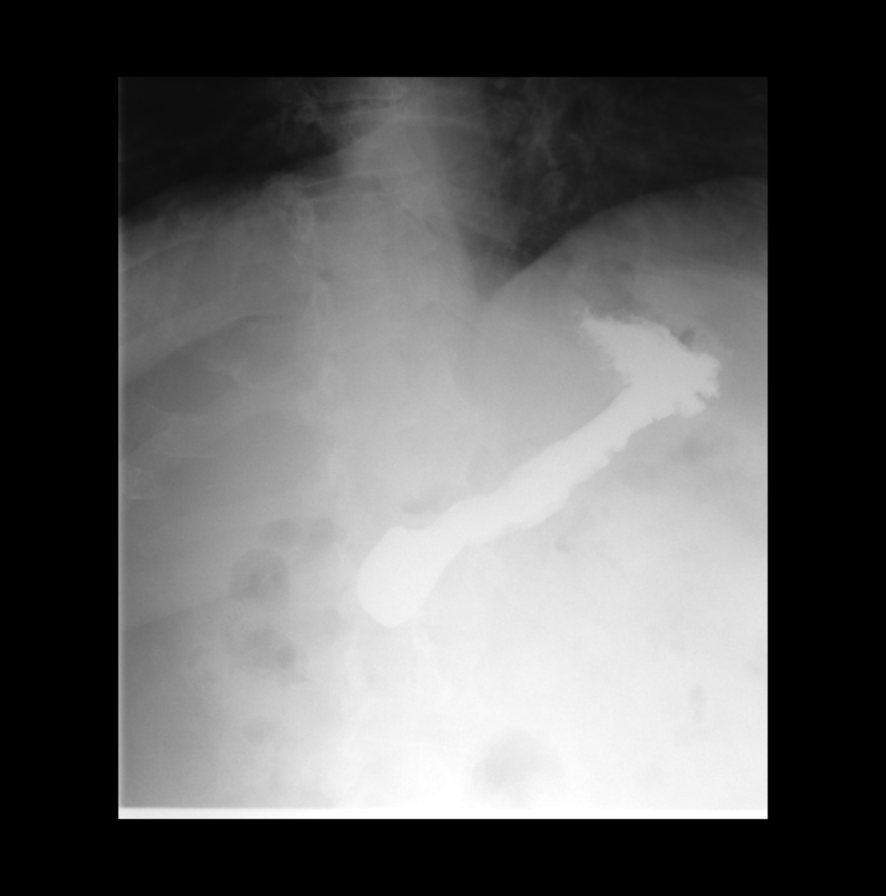
[frame 2/2]
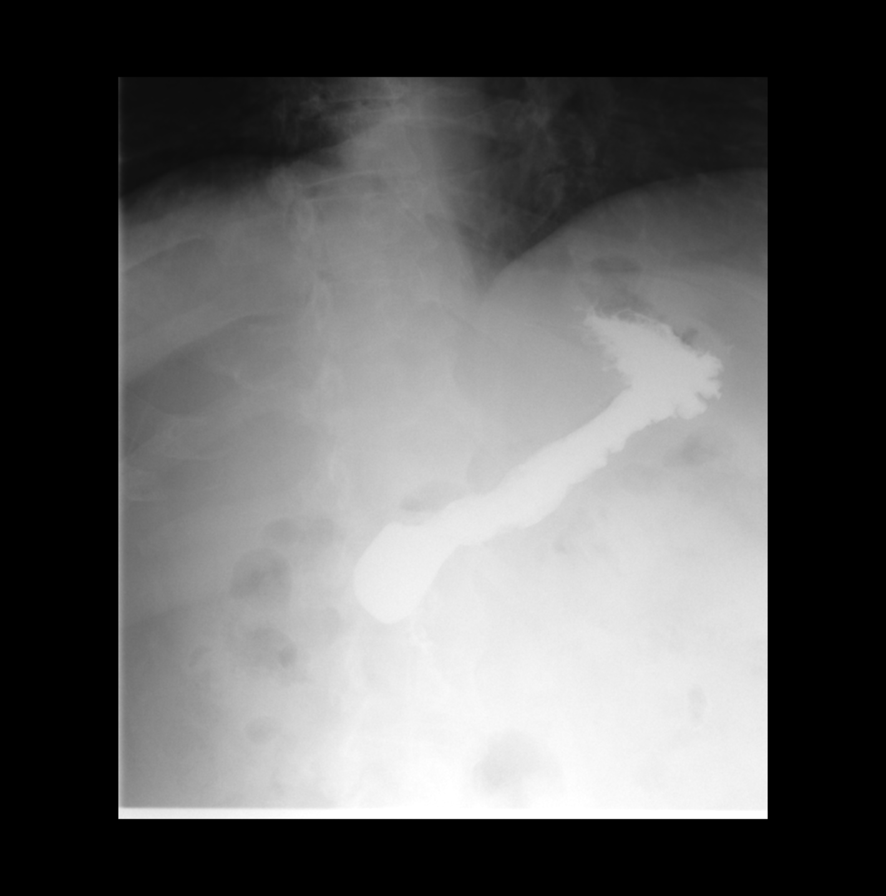

[Series 6: fluoro_barium 2fps_bw · 0.18mm/px · 1 of 1 slices shown (2 of 4)]
[im 1/1]
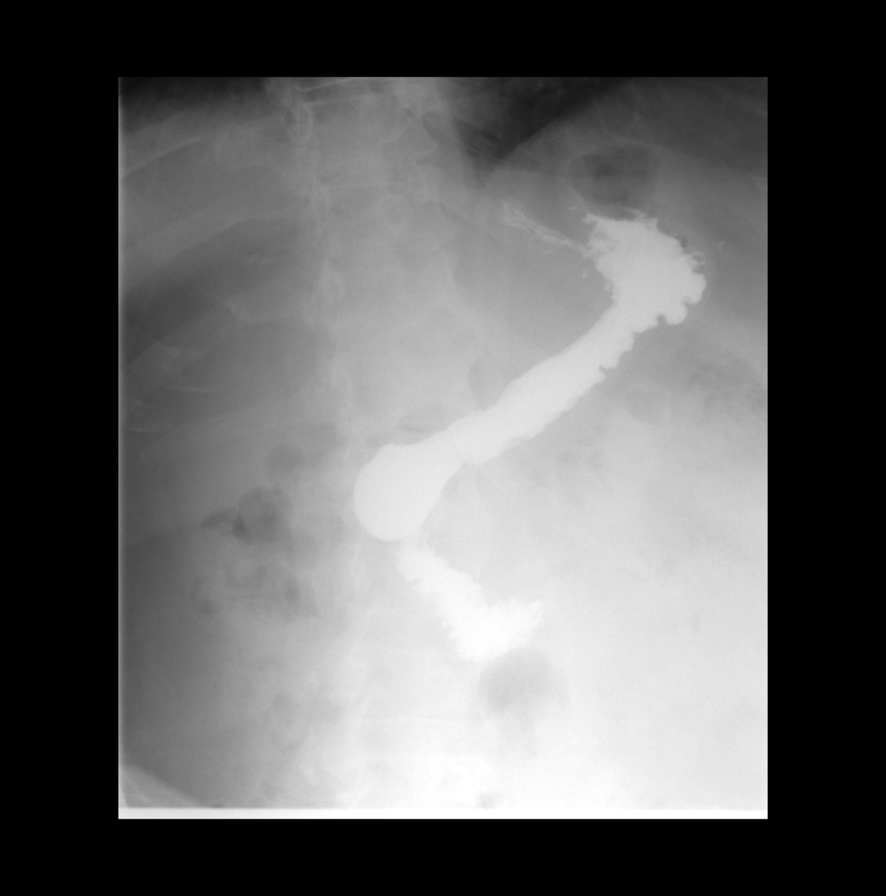

[Series 7: fluoro_barium 2fps_bw · 0.18mm/px · 1 of 1 slices shown (3 of 4)]
[im 1/1]
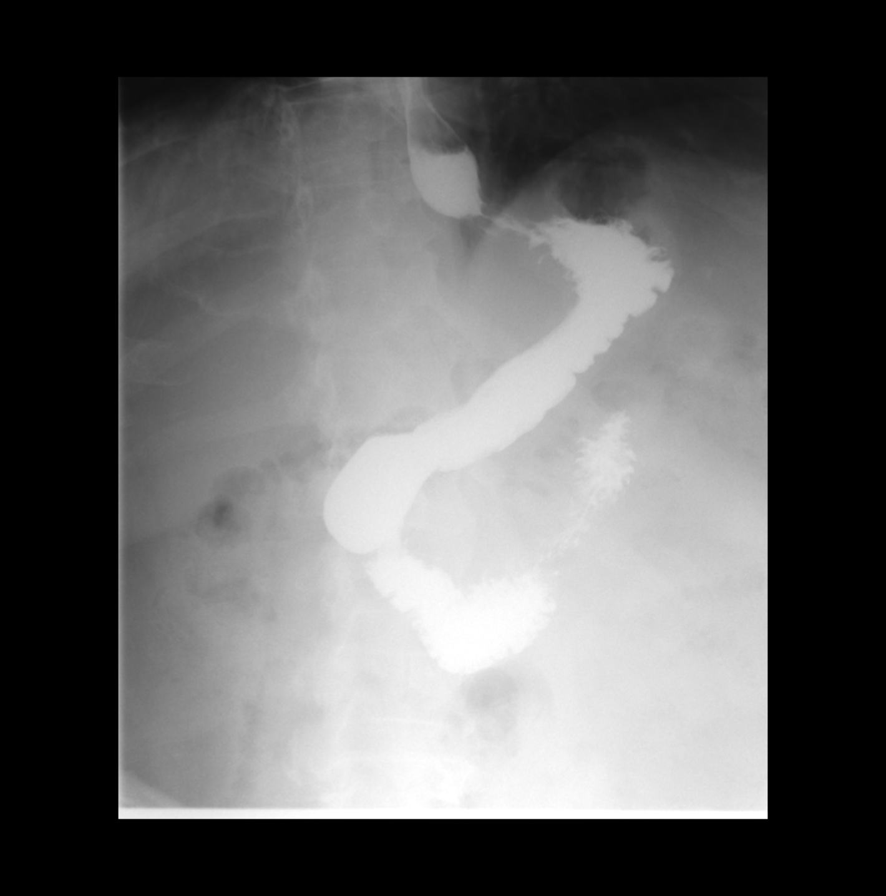

[Series 8: fluoro_barium 2fps_bw · 0.18mm/px · 1 of 1 slices shown (4 of 4)]
[im 1/1]
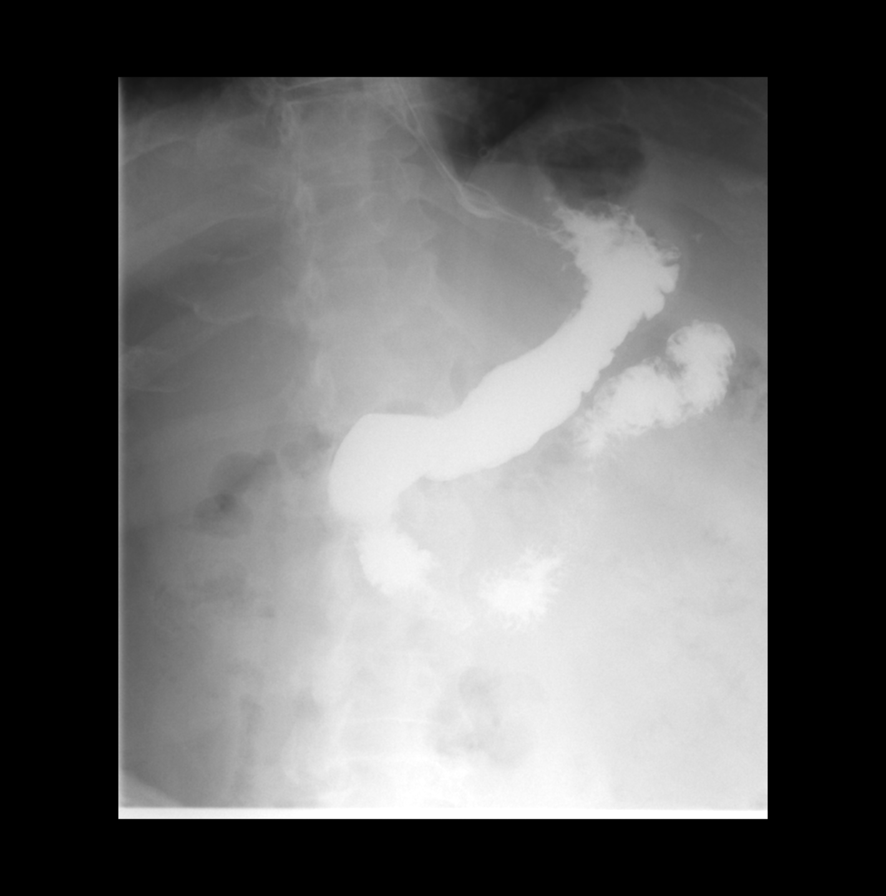

[8 of 15 positions shown; findings below may reference images not displayed]

FINDINGS: The scout KUB is negative. Normal bowel gas pattern. The mucosa and
motility of the esophagus are normal. No visible hiatal hernia. The
fundus, body, and antrum of the stomach are normal. The pylorus and
duodenal bulb and C-loop are normal.
IMPRESSION: Normal upper GI.

## 2018-03-09 DIAGNOSIS — M542 Cervicalgia: Secondary | ICD-10-CM | POA: Diagnosis not present

## 2018-04-04 DIAGNOSIS — E039 Hypothyroidism, unspecified: Secondary | ICD-10-CM | POA: Diagnosis not present

## 2018-04-04 DIAGNOSIS — E119 Type 2 diabetes mellitus without complications: Secondary | ICD-10-CM | POA: Diagnosis not present

## 2018-04-04 DIAGNOSIS — Z0000 Encounter for general adult medical examination without abnormal findings: Secondary | ICD-10-CM | POA: Diagnosis not present

## 2018-04-04 DIAGNOSIS — Z6841 Body Mass Index (BMI) 40.0 and over, adult: Secondary | ICD-10-CM | POA: Diagnosis not present

## 2018-04-04 DIAGNOSIS — Z125 Encounter for screening for malignant neoplasm of prostate: Secondary | ICD-10-CM | POA: Diagnosis not present

## 2018-04-04 DIAGNOSIS — N529 Male erectile dysfunction, unspecified: Secondary | ICD-10-CM | POA: Diagnosis not present

## 2018-04-09 DIAGNOSIS — N529 Male erectile dysfunction, unspecified: Secondary | ICD-10-CM

## 2018-04-09 HISTORY — DX: Male erectile dysfunction, unspecified: N52.9

## 2018-05-12 DIAGNOSIS — M542 Cervicalgia: Secondary | ICD-10-CM | POA: Diagnosis not present

## 2018-05-12 DIAGNOSIS — N529 Male erectile dysfunction, unspecified: Secondary | ICD-10-CM | POA: Diagnosis not present

## 2018-05-29 ENCOUNTER — Encounter: Payer: BLUE CROSS/BLUE SHIELD | Attending: General Surgery | Admitting: Registered"

## 2018-05-29 ENCOUNTER — Encounter: Payer: Self-pay | Admitting: Registered"

## 2018-05-29 DIAGNOSIS — Z713 Dietary counseling and surveillance: Secondary | ICD-10-CM | POA: Diagnosis not present

## 2018-05-29 DIAGNOSIS — E119 Type 2 diabetes mellitus without complications: Secondary | ICD-10-CM

## 2018-05-29 NOTE — Progress Notes (Signed)
Follow-up visit: 12 Months Post-Operative Sleeve gastrectomy Surgery  Medical Nutrition Therapy:  Appt start time: 8:00 end time: 8:45  Primary concerns today: Post-operative Bariatric Surgery Nutrition Management.  Non scale victories: feels better physically and mentally, no longer taking metformin, increased mobility, less aches and pains, improved knee pain  Surgery date: 05/30/2017 Surgery type: Sleeve gastrectomy Start weight at [facility]: 329.5 Weight today: 272.6 lbs Weight change: 1.8 lbs gain from 270.8 lbs on 05/29/2017 Total weight lost: 56.9 lbs loss Weight loss goal: ~225-240 lbs, BMI in upper 20s-low 30s  TANITA  BODY COMP RESULTS  06/14/2017 07/26/2017 11/21/2017 05/29/2018   BMI (kg/m^2) 42.5 39.6 37.8 38.0   Fat Mass (lbs) 128.8 112.2 100.4 110.2   Fat Free Mass (lbs) 175.6 171.6 170.4 162.4   Total Body Water (lbs) 134.0 127.6 124.0 121.6    Pt states he forgets to take calcium throughout the day. Pt states he mindlessly eats when watching the tv or at the countertop in kitchen. Pt states tea is a bad option for him, states it is his weakness. Pt states he has reminders set to take calcium supplements but ignores them.   Pt reports enjoying almonds, pecans, walnuts, and peanuts. Pt states he is a member at [employer].   Preferred Learning Style:   No preference indicated   Learning Readiness:   Ready  Change in progress  24-hr recall: B (AM): [employer]'s-breakfast burrito or 1-2 eggs (12g), bacon (12g), 3 tsp grits Snk (AM): almonds (7g), walnuts  L (PM): soup (vegeatble beef, tomato) or tuna (15g) or chopped barbecue, cole slaw Snk (PM): nuts  D (PM): 4-5 oz grilled chicken/steak filet (28-35g), 1/2 baked potato, mushrooms, corn on cob  Snk (PM): cheese, sometimes sugar-free popsicles  Fluid intake: coffee (8 oz), sweet/unsweetened tea (16 oz), water (34 oz), 1% or skim milk ; ~58 oz Estimated total protein intake: 80+  Medications: Not taking metformin,  aspirin  Supplementation: Yes; Bariatric Advantage + 3 Ca  CBG monitoring: No, pt reports doctor says it is good Average CBG per patient: N/A Last patient reported A1c: 9.4 on 05/11/2017  Using straws: every now and then Drinking while eating: sips  Having you been chewing well: yes Chewing/swallowing difficulties: no Changes in vision: no Changes to mood/headaches: no Hair loss/Changes to skin/Changes to nails: no, no, no Any difficulty focusing or concentrating: no, no Sweating: no Dizziness/Lightheaded: sometimes when bending at the waist, monitored by cardiologist Palpitations: no Carbonated beverages: no N/V/D/C/GAS: no, no, no, sometimes taking Miralax, no Abdominal Pain: no Dumping syndrome: no Last Lap-Band fill: N/A  Recent physical activity:  Walking at work, Programmer, multimedia couple of days  Progress Towards Goal(s):  In progress.  Handouts given during visit include:  Should I Eat flowsheet   Nutritional Diagnosis:  [place]-3.3 Overweight/obesity related to past poor dietary habits and physical inactivity as evidenced by patient w/ recent sleeve gastrectomy surgery following dietary guidelines for continued weight loss.     Intervention:  Nutrition education and counseling. Pt was educated and counseled on ways to meet calcium goals daily with supplements, other appropriate bariatric multivitamin options, mindful eating strategies. Pt was in agreement with goals listed.  Goals: - Take 3 calcium supplements a day: while eating nuts with morning snack, afternoon snack, and lunch.  - Try Bariatric Advantage Ultra Solo with iron capsule.  - Practice mindful eating.  - Check into support group every 3rd Thursday of the month 6-7pm. Next support group is Thurs, July 18.  Teaching  Method Utilized:  Visual Auditory Hands on  Barriers to learning/adherence to lifestyle change: none  Demonstrated degree of understanding via:  Teach Back   Monitoring/Evaluation:  Dietary  intake, exercise, lap band fills, and body weight. Follow up in 5 months for 18 month post-op visit.

## 2018-05-29 NOTE — Patient Instructions (Addendum)
-   Take 3 calcium supplements a day: while eating nuts with morning snack, afternoon snack, and lunch.   - Try Bariatric Advantage Ultra Solo with iron capsule.   - Practice mindful eating.   - Check into support group every 3rd Thursday of the month 6-7pm. Next support group is Thurs, July 18.

## 2018-06-07 DIAGNOSIS — N5203 Combined arterial insufficiency and corporo-venous occlusive erectile dysfunction: Secondary | ICD-10-CM | POA: Diagnosis not present

## 2018-06-08 DIAGNOSIS — R7989 Other specified abnormal findings of blood chemistry: Secondary | ICD-10-CM | POA: Diagnosis not present

## 2018-06-26 ENCOUNTER — Ambulatory Visit: Payer: BLUE CROSS/BLUE SHIELD | Admitting: Family Medicine

## 2018-06-26 ENCOUNTER — Encounter

## 2018-07-05 DIAGNOSIS — H1045 Other chronic allergic conjunctivitis: Secondary | ICD-10-CM | POA: Diagnosis not present

## 2018-07-05 DIAGNOSIS — J3089 Other allergic rhinitis: Secondary | ICD-10-CM | POA: Diagnosis not present

## 2018-07-14 DIAGNOSIS — J310 Chronic rhinitis: Secondary | ICD-10-CM | POA: Diagnosis not present

## 2018-07-14 DIAGNOSIS — Z9989 Dependence on other enabling machines and devices: Secondary | ICD-10-CM | POA: Diagnosis not present

## 2018-07-14 DIAGNOSIS — G4733 Obstructive sleep apnea (adult) (pediatric): Secondary | ICD-10-CM | POA: Diagnosis not present

## 2018-08-01 DIAGNOSIS — M542 Cervicalgia: Secondary | ICD-10-CM | POA: Diagnosis not present

## 2018-08-08 DIAGNOSIS — M546 Pain in thoracic spine: Secondary | ICD-10-CM | POA: Diagnosis not present

## 2018-09-14 DIAGNOSIS — G4733 Obstructive sleep apnea (adult) (pediatric): Secondary | ICD-10-CM | POA: Diagnosis not present

## 2018-10-09 DIAGNOSIS — E119 Type 2 diabetes mellitus without complications: Secondary | ICD-10-CM | POA: Diagnosis not present

## 2018-10-09 DIAGNOSIS — Z0000 Encounter for general adult medical examination without abnormal findings: Secondary | ICD-10-CM | POA: Diagnosis not present

## 2018-10-09 DIAGNOSIS — I10 Essential (primary) hypertension: Secondary | ICD-10-CM | POA: Diagnosis not present

## 2018-10-09 DIAGNOSIS — E039 Hypothyroidism, unspecified: Secondary | ICD-10-CM | POA: Diagnosis not present

## 2018-10-10 DIAGNOSIS — E7800 Pure hypercholesterolemia, unspecified: Secondary | ICD-10-CM | POA: Diagnosis not present

## 2018-10-10 DIAGNOSIS — D696 Thrombocytopenia, unspecified: Secondary | ICD-10-CM | POA: Diagnosis not present

## 2018-10-10 DIAGNOSIS — E039 Hypothyroidism, unspecified: Secondary | ICD-10-CM | POA: Diagnosis not present

## 2018-10-10 DIAGNOSIS — I10 Essential (primary) hypertension: Secondary | ICD-10-CM | POA: Diagnosis not present

## 2018-10-14 DIAGNOSIS — J060 Acute laryngopharyngitis: Secondary | ICD-10-CM | POA: Diagnosis not present

## 2018-10-14 DIAGNOSIS — B9689 Other specified bacterial agents as the cause of diseases classified elsewhere: Secondary | ICD-10-CM | POA: Diagnosis not present

## 2018-10-14 DIAGNOSIS — J208 Acute bronchitis due to other specified organisms: Secondary | ICD-10-CM | POA: Diagnosis not present

## 2018-10-30 ENCOUNTER — Ambulatory Visit: Payer: BLUE CROSS/BLUE SHIELD | Admitting: Registered"

## 2018-12-05 DIAGNOSIS — M542 Cervicalgia: Secondary | ICD-10-CM | POA: Diagnosis not present

## 2018-12-30 NOTE — Progress Notes (Signed)
[place] at [facility] [address], [place], [place] [address] [phone]  Date:  01/03/2019   Name:  Scott Bauer   DOB:  1956-04-25   MRN:  [id number]  PCP:  Scott Mclean, MD    Chief Complaint: New Patient (Initial Visit) (labs, fasting (1 cup of black coffee))   History of Present Illness:  Scott Bauer is a 63 y.o. very pleasant male patient who presents with the following:  New patient here to establish care History of diabetes, hypertension, hyperlipidemia, sleep apnea, obesity, hypothyroidism, idiopathic thrombocytopenia, history of MI status post stents in 2008 and 2010 His DM is now diet controlled since his weight loss surgery  He had ablation of his thyroid in the mid 90s  He is transferring care from a [facility] practice I also take care of his wife and he wanted to be part of the same practice Cardiologist; Dr Scott Bauer in [place] labs on epic chart from November 2019 Last A1c in November was 5.6 LDL Direct 78 <130 mg/dL  Total Cholesterol 132 25 - 199 MG/DL  Triglycerides 77 10 - 150 MG/DL  HDL Cholesterol 49 35 - 135 MG/DL  Total Chol / HDL Cholesterol 2.7 <4.5  Non-HDL Cholesterol 83 MG/DL   He was an Clinical biochemist for years, 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 Redmond Bauer.  His heaviest weight was 357 His lowest weight was 261  He notes that he has "63 yo aches and pains," he sees Dr. Tonita Bauer at [facility] ortho for knee pain He also sees a chiro as needed for 30 years   He has nitro on hand but has never needed to use it  Ophthalmology:[facility]  Colon cancer screening: he is up to date but cannot remember the name of his doctor  [facility] [place]  has some immun records -we will call and request these  His last TSH was a little bit low; they put him on brand thyroid at his last visit and adjust his dose.  We will recheck his TSH today  Wt Readings from Last 3 Encounters:  01/03/19 286 lb (129.7 kg)  05/29/18 272 lb 9.6 oz (123.7 kg)  11/21/17 270 lb 12.8 oz (122.8 kg)    Patient Active Problem List   Diagnosis Date Noted  . Obesity, Class III, BMI 40-49.9 (morbid obesity) ([place]) 05/30/2017  . Diabetes mellitus type 2 in obese ([place]) 05/30/2017  . Essential hypertension 05/30/2017  . OSA on CPAP 05/30/2017  . Hypercholesterolemia 05/30/2017  . Morbid obesity ([place]) 05/30/2017    Past Medical History:  Diagnosis Date  . Allergy   . Anal fissure   . Colon polyp   . Diabetes mellitus without complication ([place])    type 2   . Fatty liver   . Heart disease   . Hyperlipidemia   . Hypertension   . Hypothyroidism   . Myocardial infarction [facility]) 2008   placed 2 stents ; had additional stent placed in 2010  . Pneumonia 2000  . Sleep apnea     Past Surgical History:  Procedure Laterality Date  . ANAL FISSURE REPAIR    . CARDIAC  CATHETERIZATION    . LAPAROSCOPIC GASTRIC SLEEVE RESECTION N/A 05/30/2017   Procedure: LAPAROSCOPIC GASTRIC SLEEVE RESECTION, UPPER ENDO;  Surgeon: Scott Pickerel, MD;  Location: [facility];  Service: General;  Laterality: N/A;  . NASAL SINUS SURGERY      Social History   Tobacco Use  . Smoking status: Former Smoker    Types: Cigarettes, Cigars    Last attempt to quit: 2008    Years since quitting: 12.1  . Smokeless tobacco: Never Used  . Tobacco comment: occasionally   Substance Use Topics  . Alcohol use: Yes    Alcohol/week: 1.0 standard drinks    Types: 1 Cans of beer per week    Comment: beer occasionally , sometimes a mixed drink   . Drug use: No    Comment: occasionally "marijuana gummie" out of town    Family History  Problem Relation Age of Onset  . Cancer Other   . Hypertension Other    . Diabetes Other     Allergies  Allergen Reactions  . Cortisone   . Prednisone Nausea Only and Other (See Comments)    Makes him feel funny  Only has issues when takes dose packs    Medication list has been reviewed and updated.  Current Outpatient Medications on File Prior to Visit  Medication Sig Dispense Refill  . aspirin EC 81 MG tablet Take 81 mg by mouth daily.    Marland Kitchen atorvastatin (LIPITOR) 40 MG tablet Take 40 mg by mouth daily at 6 PM.     . carvedilol (COREG) 6.25 MG tablet Take 6.25 mg by mouth 2 (two) times daily with a meal.    . cetirizine (ZYRTEC) 10 MG tablet TK 1 T PO QD    . Cholecalciferol (VITAMIN D-3) 5000 units TABS Take 5,000 Units by mouth every evening.    . clopidogrel (PLAVIX) 75 MG tablet Take 1 tablet (75 mg total) by mouth daily.    . Coenzyme Q10 (COQ10) 100 MG CAPS Take 100 mg by mouth daily.    . ramipril (ALTACE) 2.5 MG capsule Take 2.5 mg by mouth daily.    . vitamin E 400 UNIT capsule Take 400 Units by mouth daily.    . metFORMIN (GLUCOPHAGE) 500 MG tablet Take 1,000 mg by mouth 2 (two) times daily with a meal.     . saxagliptin HCl (ONGLYZA) 5 MG TABS tablet Take 5 mg by mouth daily.     No current facility-administered medications on file prior to visit.     Review of Systems:  As per HPI- otherwise negative. No fever or chills, no chest pain or shortness of breath  Physical Examination: Vitals:   01/03/19 0849  BP: 112/72  Pulse: 78  Resp: 16  Temp: 98.2 F (36.8 C)  SpO2: 98%   Vitals:   01/03/19 0849  Weight: 286 lb (129.7 kg)  Height: 5\' 11"  (1.803 m)   Body mass index is 39.89 kg/m. Ideal Body Weight: Weight in (lb) to have BMI = 25: 178.9  GEN: WDWN, NAD, Non-toxic, A & O x 3, obese, otherwise looks well HEENT: Atraumatic, Normocephalic. Neck supple. No masses, No LAD. Bilateral TM wnl, oropharynx normal.  PEERL,EOMI.   Ears and Nose: No external deformity. CV: RRR, No M/G/R. No JVD. No thrill. No extra heart  sounds. PULM: CTA B, no wheezes, crackles, rhonchi. No retractions. No resp. distress. No accessory muscle use. ABD: S, NT, ND EXTR: No c/c/e NEURO Normal gait.  PSYCH: Normally interactive. Conversant.  Not depressed or anxious appearing.  Calm demeanor.    Assessment and Plan: Postablative hypothyroidism - Plan: TSH  Diet-controlled diabetes mellitus ([place])  Idiopathic thrombocytopenia (HCC)  History of MI (myocardial infarction)  Encounter for medical examination to establish care  Here today to establish care He has history of idiopathic thrombocytopenia, labs were checked in November and looked okay.  Platelet count was 99k that time He has history of MI and is undergone coronary stenting twice.  He does not have any chest pain, and his current blood pressure is fine Since his gastric sleeve procedure, he no longer has diabetes per his report.  Indeed his last A1c was in normal range. Thyroid dose was recently adjusted, will check TSH from today  Asked him to come and see me in about 6 months for physical and fasting labs  Signed Lamar Blinks, MD

## 2019-01-03 ENCOUNTER — Ambulatory Visit (INDEPENDENT_AMBULATORY_CARE_PROVIDER_SITE_OTHER): Payer: BLUE CROSS/BLUE SHIELD | Admitting: Family Medicine

## 2019-01-03 ENCOUNTER — Encounter: Payer: Self-pay | Admitting: Family Medicine

## 2019-01-03 VITALS — BP 112/72 | HR 78 | Temp 98.2°F | Resp 16 | Ht 71.0 in | Wt 286.0 lb

## 2019-01-03 DIAGNOSIS — E119 Type 2 diabetes mellitus without complications: Secondary | ICD-10-CM | POA: Diagnosis not present

## 2019-01-03 DIAGNOSIS — E890 Postprocedural hypothyroidism: Secondary | ICD-10-CM

## 2019-01-03 DIAGNOSIS — I252 Old myocardial infarction: Secondary | ICD-10-CM

## 2019-01-03 DIAGNOSIS — D693 Immune thrombocytopenic purpura: Secondary | ICD-10-CM | POA: Insufficient documentation

## 2019-01-03 DIAGNOSIS — Z0000 Encounter for general adult medical examination without abnormal findings: Secondary | ICD-10-CM

## 2019-01-03 HISTORY — DX: Old myocardial infarction: I25.2

## 2019-01-03 HISTORY — DX: Immune thrombocytopenic purpura: D69.3

## 2019-01-03 LAB — TSH: TSH: 2.23 u[IU]/mL (ref 0.35–4.50)

## 2019-01-03 NOTE — Patient Instructions (Signed)
It was great to meet you today!  I will be in touch with your thyroid levels asap  Otherwise, please plan to see me this summer for fasting labs and a physical See if you can remember the name of your GI doc and I will request your most recnet colonoscopy reports We will contact your [facility] and get your immunization info

## 2019-01-04 DIAGNOSIS — M542 Cervicalgia: Secondary | ICD-10-CM | POA: Diagnosis not present

## 2019-01-15 DIAGNOSIS — J3089 Other allergic rhinitis: Secondary | ICD-10-CM | POA: Diagnosis not present

## 2019-01-15 DIAGNOSIS — H1045 Other chronic allergic conjunctivitis: Secondary | ICD-10-CM | POA: Diagnosis not present

## 2019-01-25 ENCOUNTER — Encounter (HOSPITAL_COMMUNITY): Payer: Self-pay

## 2019-03-20 DIAGNOSIS — M542 Cervicalgia: Secondary | ICD-10-CM | POA: Diagnosis not present

## 2019-06-06 DIAGNOSIS — E039 Hypothyroidism, unspecified: Secondary | ICD-10-CM | POA: Diagnosis not present

## 2019-06-06 DIAGNOSIS — Z9989 Dependence on other enabling machines and devices: Secondary | ICD-10-CM | POA: Diagnosis not present

## 2019-06-06 DIAGNOSIS — I10 Essential (primary) hypertension: Secondary | ICD-10-CM | POA: Diagnosis not present

## 2019-06-06 DIAGNOSIS — G4733 Obstructive sleep apnea (adult) (pediatric): Secondary | ICD-10-CM | POA: Diagnosis not present

## 2019-06-06 DIAGNOSIS — E782 Mixed hyperlipidemia: Secondary | ICD-10-CM | POA: Diagnosis not present

## 2019-06-06 DIAGNOSIS — I252 Old myocardial infarction: Secondary | ICD-10-CM | POA: Diagnosis not present

## 2019-06-06 DIAGNOSIS — Z9884 Bariatric surgery status: Secondary | ICD-10-CM | POA: Diagnosis not present

## 2019-06-06 DIAGNOSIS — Z79899 Other long term (current) drug therapy: Secondary | ICD-10-CM | POA: Diagnosis not present

## 2019-06-06 DIAGNOSIS — Z87891 Personal history of nicotine dependence: Secondary | ICD-10-CM | POA: Diagnosis not present

## 2019-06-06 DIAGNOSIS — E119 Type 2 diabetes mellitus without complications: Secondary | ICD-10-CM | POA: Diagnosis not present

## 2019-06-06 DIAGNOSIS — I2510 Atherosclerotic heart disease of native coronary artery without angina pectoris: Secondary | ICD-10-CM | POA: Diagnosis not present

## 2019-06-18 NOTE — Progress Notes (Addendum)
[place] at [facility] [place], [place], [place] [address] [phone]  Date:  06/20/2019   Name:  Scott Bauer   DOB:  09/04/1956   MRN:  [id number]  PCP:  Darreld Mclean, MD    Chief Complaint: Annual Exam (referral to dermatology, changing cardiology closer to home)   History of Present Illness:  Scott Bauer is a 63 y.o. 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 Wyline Copas in [facility]- last visit with the PA was last week. They did increase his lipitor They did check a chl but not other labs  He would like to change to [facility] to get all his care in one place- will refer him   He has noted a spot on his right arm- it looked suspicious to him but then resolved. It may have just been a bruise.  Went away after a week or so He would like a derm referral  Never had skin cancer in the past but he has had sun exposure    Lab Results  Component Value Date   TSH 2.23 01/03/2019   Foot exam due Eye exam: he is UTD per his report  Tetanus: he is not sure- will give tdap due to new granddaughter  HIV and hep C screening: Labs: fasting today  Pneumonia vaccine: UTD Shingrix: done Colon: he is not quite sure when this was done.  He was given 5- 10 years at last screening.  We have sent record request to his GI doc    Patient Active Problem List   Diagnosis Date Noted  . Postablative hypothyroidism 01/03/2019  .  Diet-controlled diabetes mellitus ([place]) 01/03/2019  . Idiopathic thrombocytopenia ([place]) 01/03/2019  . History of MI (myocardial infarction) 01/03/2019  . Obesity, Class III, BMI 40-49.9 (morbid obesity) ([place]) 05/30/2017  . Diabetes mellitus type 2 in obese ([place]) 05/30/2017  . Essential hypertension 05/30/2017  . OSA on CPAP 05/30/2017  . Hypercholesterolemia 05/30/2017  . Morbid obesity ([place]) 05/30/2017    Past Medical History:  Diagnosis Date  . Allergy   . Anal fissure   . Colon polyp   . Diabetes mellitus without complication ([place])    type 2   . Fatty liver   . Heart disease   . Hyperlipidemia   . Hypertension   . Hypothyroidism   . Myocardial infarction [facility]) 2008   placed 2 stents ; had additional stent placed in 2010  . Pneumonia 2000  . Sleep apnea     Past Surgical History:  Procedure Laterality Date  . ANAL FISSURE REPAIR    . CARDIAC CATHETERIZATION    . LAPAROSCOPIC GASTRIC SLEEVE RESECTION N/A 05/30/2017   Procedure: LAPAROSCOPIC GASTRIC SLEEVE RESECTION, UPPER ENDO;  Surgeon: Greer Pickerel, MD;  Location: [facility];  Service: General;  Laterality: N/A;  . NASAL SINUS SURGERY      Social History  Tobacco Use  . Smoking status: Former Smoker    Types: Cigarettes, Cigars    Quit date: 2008    Years since quitting: 12.5  . Smokeless tobacco: Never Used  . Tobacco comment: occasionally   Substance Use Topics  . Alcohol use: Yes    Alcohol/week: 1.0 standard drinks    Types: 1 Cans of beer per week    Comment: beer occasionally , sometimes a mixed drink   . Drug use: No    Comment: occasionally "marijuana gummie" out of town    Family History  Problem Relation Age of Onset  . Cancer Other   . Hypertension Other   . Diabetes Other   . Depression Mother   . Diabetes Mother   . Kidney disease Mother   . Miscarriages / [place] Mother   . Alcohol abuse Father   . Arthritis Father   . Cancer Father   . Heart attack Father   . Heart disease Sister    . Hyperlipidemia Sister   . Hypertension Sister   . Depression Sister   . Drug abuse Sister   . Early death Sister   . Arthritis Brother   . Heart attack Brother   . Heart attack Maternal Grandmother   . Arthritis Maternal Grandfather     Allergies  Allergen Reactions  . Cortisone   . Prednisone Nausea Only and Other (See Comments)    Makes him feel funny  Only has issues when takes dose packs    Medication list has been reviewed and updated.  Current Outpatient Medications on File Prior to Visit  Medication Sig Dispense Refill  . aspirin EC 81 MG tablet Take 81 mg by mouth daily.    Marland Kitchen atorvastatin (LIPITOR) 40 MG tablet Take 40 mg by mouth daily at 6 PM.     . carvedilol (COREG) 6.25 MG tablet Take 6.25 mg by mouth 2 (two) times daily with a meal.    . cetirizine (ZYRTEC) 10 MG tablet TK 1 T PO QD    . Cholecalciferol (VITAMIN D-3) 5000 units TABS Take 5,000 Units by mouth every evening.    . clopidogrel (PLAVIX) 75 MG tablet Take 1 tablet (75 mg total) by mouth daily.    . Coenzyme Q10 (COQ10) 100 MG CAPS Take 100 mg by mouth daily.    . ramipril (ALTACE) 2.5 MG capsule Take 2.5 mg by mouth daily.    Marland Kitchen UNABLE TO FIND Med Name: Lpratropium 0.06% 55mL as needed    . UNABLE TO FIND 500 mg daily. Med Name: Mega Red 500 mg    . UNABLE TO FIND Take 500 mg by mouth 3 (three) times daily. Med Name: Calcium Citrate    . UNABLE TO FIND Take by mouth daily. Med Name: Bariatric Advantage    . vitamin E 400 UNIT capsule Take 400 Units by mouth daily.     No current facility-administered medications on file prior to visit.     Review of Systems:  As per HPI- otherwise negative. He has gained a few lbs over the last year- he plans to work on this  No sx of illness recently   Physical Examination: Vitals:   06/20/19 0823  BP: 126/68  Pulse: 70  Resp: 16  Temp: 98.3 F (36.8 C)  SpO2: 97%   Vitals:   06/20/19 0823  Weight: 292 lb (132.5 kg)  Height: 5\' 11"  (1.803 m)    Body mass index is 40.73 kg/m. Ideal Body Weight: Weight  in (lb) to have BMI = 25: 178.9  GEN: WDWN, NAD, Non-toxic, A & O x 3, obese, looks well  HEENT: Atraumatic, Normocephalic. Neck supple. No masses, No LAD. TM wnl bilaterally  Ears and Nose: No external deformity. CV: RRR, No M/G/R. No JVD. No thrill. No extra heart sounds. PULM: CTA B, no wheezes, crackles, rhonchi. No retractions. No resp. distress. No accessory muscle use. ABD: S, NT, ND, +BS. No rebound. No HSM. EXTR: No c/c/e. Pt notes he may get trace ankle swelling at the end of the day, resolves overnight  NEURO Normal gait.  PSYCH: Normally interactive. Conversant. Not depressed or anxious appearing.  Calm demeanor.  Foot exam done today  Wt Readings from Last 3 Encounters:  06/20/19 292 lb (132.5 kg)  01/03/19 286 lb (129.7 kg)  05/29/18 272 lb 9.6 oz (123.7 kg)    Assessment and Plan:   ICD-10-CM   1. Physical exam  Z00.00   2. Postablative hypothyroidism  E89.0 TSH  3. Diet-controlled diabetes mellitus (HCC)  E11.9 Comprehensive metabolic panel    Hemoglobin A1c  4. Idiopathic thrombocytopenia (HCC)  D69.3 CBC  5. History of MI (myocardial infarction)  I25.2 Ambulatory referral to Cardiology  6. Hypercholesterolemia  E78.00 Ambulatory referral to Cardiology  7. Essential hypertension  I10 Ambulatory referral to Cardiology  8. Immunization due  Z23 Tdap vaccine greater than or equal to 7yo IM  9. Screening for prostate cancer  Z12.5 PSA  10. Screening for skin cancer  Z12.83 Ambulatory referral to Dermatology     Follow-up: No follow-ups on file.  No orders of the defined types were placed in this encounter.  Orders Placed This Encounter  Procedures  . Tdap vaccine greater than or equal to 7yo IM  . CBC  . Comprehensive metabolic panel  . Hemoglobin A1c  . PSA  . TSH  . Ambulatory referral to Cardiology  . Ambulatory referral to Dermatology    @SIGN @  CPE today Updated tdap Request colon  report Labs pending as above Lipids per cardiology Encouraged weight loss   Signed Lamar Blinks, MD   Received his labs, message to patient He has known idiopathic thrombocytopenia Results for orders placed or performed in visit on 06/20/19  CBC  Result Value Ref Range   WBC 5.3 4.0 - 10.5 K/uL   RBC 4.84 4.22 - 5.81 Mil/uL   Platelets 111.0 (L) 150.0 - 400.0 K/uL   Hemoglobin 14.9 13.0 - 17.0 g/dL   HCT 43.6 39.0 - 52.0 %   MCV 90.2 78.0 - 100.0 fl   MCHC 34.0 30.0 - 36.0 g/dL   RDW 13.3 11.5 - 15.5 %  Comprehensive metabolic panel  Result Value Ref Range   Sodium 139 135 - 145 mEq/L   Potassium 4.6 3.5 - 5.1 mEq/L   Chloride 101 96 - 112 mEq/L   CO2 32 19 - 32 mEq/L   Glucose, Bld 106 (H) 70 - 99 mg/dL   BUN 18 6 - 23 mg/dL   Creatinine, Ser 0.93 0.40 - 1.50 mg/dL   Total Bilirubin 0.7 0.2 - 1.2 mg/dL   Alkaline Phosphatase 108 39 - 117 U/L   AST 26 0 - 37 U/L   ALT 48 0 - 53 U/L   Total Protein 6.9 6.0 - 8.3 g/dL   Albumin 4.4 3.5 - 5.2 g/dL   Calcium 9.5 8.4 - 10.5 mg/dL   GFR 82.27 >60.00 mL/min  Hemoglobin A1c  Result Value Ref Range   Hgb A1c  MFr Bld 5.8 4.6 - 6.5 %  PSA  Result Value Ref Range   PSA 0.55 0.10 - 4.00 ng/mL  TSH  Result Value Ref Range   TSH 1.09 0.35 - 4.50 uIU/mL

## 2019-06-20 ENCOUNTER — Encounter: Payer: Self-pay | Admitting: Family Medicine

## 2019-06-20 ENCOUNTER — Other Ambulatory Visit: Payer: Self-pay

## 2019-06-20 ENCOUNTER — Ambulatory Visit (INDEPENDENT_AMBULATORY_CARE_PROVIDER_SITE_OTHER): Payer: BC Managed Care – PPO | Admitting: Family Medicine

## 2019-06-20 VITALS — BP 126/68 | HR 70 | Temp 98.3°F | Resp 16 | Ht 71.0 in | Wt 292.0 lb

## 2019-06-20 DIAGNOSIS — Z23 Encounter for immunization: Secondary | ICD-10-CM

## 2019-06-20 DIAGNOSIS — E890 Postprocedural hypothyroidism: Secondary | ICD-10-CM | POA: Diagnosis not present

## 2019-06-20 DIAGNOSIS — Z1283 Encounter for screening for malignant neoplasm of skin: Secondary | ICD-10-CM

## 2019-06-20 DIAGNOSIS — I10 Essential (primary) hypertension: Secondary | ICD-10-CM

## 2019-06-20 DIAGNOSIS — D693 Immune thrombocytopenic purpura: Secondary | ICD-10-CM

## 2019-06-20 DIAGNOSIS — E7800 Pure hypercholesterolemia, unspecified: Secondary | ICD-10-CM

## 2019-06-20 DIAGNOSIS — Z125 Encounter for screening for malignant neoplasm of prostate: Secondary | ICD-10-CM | POA: Diagnosis not present

## 2019-06-20 DIAGNOSIS — I252 Old myocardial infarction: Secondary | ICD-10-CM

## 2019-06-20 DIAGNOSIS — E119 Type 2 diabetes mellitus without complications: Secondary | ICD-10-CM

## 2019-06-20 DIAGNOSIS — Z0000 Encounter for general adult medical examination without abnormal findings: Secondary | ICD-10-CM

## 2019-06-20 LAB — COMPREHENSIVE METABOLIC PANEL
ALT: 48 U/L (ref 0–53)
AST: 26 U/L (ref 0–37)
Albumin: 4.4 g/dL (ref 3.5–5.2)
Alkaline Phosphatase: 108 U/L (ref 39–117)
BUN: 18 mg/dL (ref 6–23)
CO2: 32 mEq/L (ref 19–32)
Calcium: 9.5 mg/dL (ref 8.4–10.5)
Chloride: 101 mEq/L (ref 96–112)
Creatinine, Ser: 0.93 mg/dL (ref 0.40–1.50)
GFR: 82.27 mL/min (ref >60.00)
Glucose, Bld: 106 mg/dL — ABNORMAL HIGH (ref 70–99)
Potassium: 4.6 mEq/L (ref 3.5–5.1)
Sodium: 139 mEq/L (ref 135–145)
Total Bilirubin: 0.7 mg/dL (ref 0.2–1.2)
Total Protein: 6.9 g/dL (ref 6.0–8.3)

## 2019-06-20 LAB — CBC
HCT: 43.6 % (ref 39.0–52.0)
Hemoglobin: 14.9 g/dL (ref 13.0–17.0)
MCHC: 34 g/dL (ref 30.0–36.0)
MCV: 90.2 fl (ref 78.0–100.0)
Platelets: 111 10*3/uL — ABNORMAL LOW (ref 150.0–400.0)
RBC: 4.84 Mil/uL (ref 4.22–5.81)
RDW: 13.3 % (ref 11.5–15.5)
WBC: 5.3 10*3/uL (ref 4.0–10.5)

## 2019-06-20 LAB — HEMOGLOBIN A1C: Hgb A1c MFr Bld: 5.8 % (ref 4.6–6.5)

## 2019-06-20 LAB — TSH: TSH: 1.09 u[IU]/mL (ref 0.35–4.50)

## 2019-06-20 LAB — PSA: PSA: 0.55 ng/mL (ref 0.10–4.00)

## 2019-06-20 NOTE — Patient Instructions (Signed)
Great to see you again today!  Congratulations to your family on Scott Bauer's arrival!  You got your Tdap vaccine today- this protects against tetanus and also the whooping cough and is important for adults who have contact with infants  Otherwise your shots are all up to date for now Pneumonia series again at age 63  Referral to cardiology and dermatology Do work on getting rid of the weight that has found you though diet and exercise.     Health Maintenance, Male Adopting a healthy lifestyle and getting preventive care are important in promoting health and wellness. Ask your health care provider about:  The right schedule for you to have regular tests and exams.  Things you can do on your own to prevent diseases and keep yourself healthy. What should I know about diet, weight, and exercise? Eat a healthy diet   Eat a diet that includes plenty of vegetables, fruits, low-fat dairy products, and lean protein.  Do not eat a lot of foods that are high in solid fats, added sugars, or sodium. Maintain a healthy weight Body mass index (BMI) is a measurement that can be used to identify possible weight problems. It estimates body fat based on height and weight. Your health care provider can help determine your BMI and help you achieve or maintain a healthy weight. Get regular exercise Get regular exercise. This is one of the most important things you can do for your health. Most adults should:  Exercise for at least 150 minutes each week. The exercise should increase your heart rate and make you sweat (moderate-intensity exercise).  Do strengthening exercises at least twice a week. This is in addition to the moderate-intensity exercise.  Spend less time sitting. Even light physical activity can be beneficial. Watch cholesterol and blood lipids Have your blood tested for lipids and cholesterol at 62 years of age, then have this test every 5 years. You may need to have your cholesterol levels  checked more often if:  Your lipid or cholesterol levels are high.  You are older than 62 years of age.  You are at high risk for heart disease. What should I know about cancer screening? Many types of cancers can be detected early and may often be prevented. Depending on your health history and family history, you may need to have cancer screening at various ages. This may include screening for:  Colorectal cancer.  Prostate cancer.  Skin cancer.  Lung cancer. What should I know about heart disease, diabetes, and high blood pressure? Blood pressure and heart disease  High blood pressure causes heart disease and increases the risk of stroke. This is more likely to develop in people who have high blood pressure readings, are of African descent, or are overweight.  Talk with your health care provider about your target blood pressure readings.  Have your blood pressure checked: ? Every 3-5 years if you are 78-65 years of age. ? Every year if you are 93 years old or older.  If you are between the ages of 63 and 71 and are a current or former smoker, ask your health care provider if you should have a one-time screening for abdominal aortic aneurysm (AAA). Diabetes Have regular diabetes screenings. This checks your fasting blood sugar level. Have the screening done:  Once every three years after age 2 if you are at a normal weight and have a low risk for diabetes.  More often and at a younger age if you are overweight or  have a high risk for diabetes. What should I know about preventing infection? Hepatitis B If you have a higher risk for hepatitis B, you should be screened for this virus. Talk with your health care provider to find out if you are at risk for hepatitis B infection. Hepatitis C Blood testing is recommended for:  Everyone born from [birth year] through 1964.  Anyone with known risk factors for hepatitis C. Sexually transmitted infections (STIs)  You should be screened  each year for STIs, including gonorrhea and chlamydia, if: ? You are sexually active and are younger than 62 years of age. ? You are older than 62 years of age and your health care provider tells you that you are at risk for this type of infection. ? Your sexual activity has changed since you were last screened, and you are at increased risk for chlamydia or gonorrhea. Ask your health care provider if you are at risk.  Ask your health care provider about whether you are at high risk for HIV. Your health care provider may recommend a prescription medicine to help prevent HIV infection. If you choose to take medicine to prevent HIV, you should first get tested for HIV. You should then be tested every 3 months for as long as you are taking the medicine. Follow these instructions at home: Lifestyle  Do not use any products that contain nicotine or tobacco, such as cigarettes, e-cigarettes, and chewing tobacco. If you need help quitting, ask your health care provider.  Do not use street drugs.  Do not share needles.  Ask your health care provider for help if you need support or information about quitting drugs. Alcohol use  Do not drink alcohol if your health care provider tells you not to drink.  If you drink alcohol: ? Limit how much you have to 0-2 drinks a day. ? Be aware of how much alcohol is in your drink. In the [place] one drink equals one 12 oz bottle of beer (355 mL), one 5 oz glass of wine (148 mL), or one 1 oz glass of hard liquor (44 mL). General instructions  Schedule regular health, dental, and eye exams.  Stay current with your vaccines.  Tell your health care provider if: ? You often feel depressed. ? You have ever been abused or do not feel safe at home. Summary  Adopting a healthy lifestyle and getting preventive care are important in promoting health and wellness.  Follow your health care provider's instructions about healthy diet, exercising, and getting tested or  screened for diseases.  Follow your health care provider's instructions on monitoring your cholesterol and blood pressure. This information is not intended to replace advice given to you by your health care provider. Make sure you discuss any questions you have with your health care provider. Document Released: 05/06/2008 Document Revised: 11/01/2018 Document Reviewed: 11/01/2018 Elsevier Patient Education  2020 [employer].

## 2019-06-25 ENCOUNTER — Ambulatory Visit (INDEPENDENT_AMBULATORY_CARE_PROVIDER_SITE_OTHER): Payer: BC Managed Care – PPO | Admitting: Cardiology

## 2019-06-25 ENCOUNTER — Other Ambulatory Visit: Payer: Self-pay

## 2019-06-25 ENCOUNTER — Encounter: Payer: Self-pay | Admitting: Cardiology

## 2019-06-25 VITALS — BP 122/84 | HR 60 | Ht 71.0 in | Wt 296.8 lb

## 2019-06-25 DIAGNOSIS — I2510 Atherosclerotic heart disease of native coronary artery without angina pectoris: Secondary | ICD-10-CM

## 2019-06-25 DIAGNOSIS — I252 Old myocardial infarction: Secondary | ICD-10-CM

## 2019-06-25 DIAGNOSIS — I10 Essential (primary) hypertension: Secondary | ICD-10-CM

## 2019-06-25 DIAGNOSIS — E7800 Pure hypercholesterolemia, unspecified: Secondary | ICD-10-CM | POA: Diagnosis not present

## 2019-06-25 NOTE — Progress Notes (Signed)
Cardiology Office Note:    Date:  06/25/2019   ID:  Scott Bauer, DOB 09-Mar-1956, MRN [id number]  PCP:  Darreld Mclean, MD  Cardiologist:  Jenean Lindau, MD   Referring MD: Darreld Mclean, MD    ASSESSMENT:    1. Essential hypertension   2. Coronary artery disease involving native coronary artery of native heart without angina pectoris   3. Hypercholesterolemia   4. Old MI (myocardial infarction)   5. Obesity, Class III, BMI 40-49.9 (morbid obesity) ([place])    PLAN:    In order of problems listed above:  1. Coronary artery disease: Secondary prevention stressed with the patient.  Importance of compliance with diet and medication stressed and he vocalized understanding.  He does have nitroglycerin and knows how to use it.  He uses aspirin on a regular basis. 2. Essential hypertension: Blood pressure stable 3. Diabetes mellitus: Diet was discussed and weight reduction was stressed.  Risks of obesity explained and he vocalized understanding.  I told him to walk at least half an hour a day on a regular basis.  Echocardiogram will be done to assess murmur heard on auscultation. 4. Mixed dyslipidemia: Diet was discussed he will continue taking his current medications 5. Patient will be seen in follow-up appointment in 6 months or earlier if the patient has any concerns    Medication Adjustments/Labs and Tests Ordered: Current medicines are reviewed at length with the patient today.  Concerns regarding medicines are outlined above.  No orders of the defined types were placed in this encounter.  No orders of the defined types were placed in this encounter.    History of Present Illness:    Scott Bauer is a 63 y.o. male who is being seen today for the evaluation of coronary artery disease and to get established at the request of Copland, Gay Filler, MD.  Patient is a pleasant 63 year old male.  He has past medical history of coronary artery disease post coronary  stenting, essential hypertension, dyslipidemia.  He denies any problems at this time and takes care of activities of daily living.  No chest pain orthopnea or PND.  He leads a sedentary lifestyle.  He is overweight.  He wants to be established with our practice here.  At the time of my evaluation, the patient is alert awake oriented and in no distress.  Past Medical History:  Diagnosis Date   Allergy    Anal fissure    Colon polyp    Diabetes mellitus without complication ([place])    type 2    Fatty liver    Heart disease    Hyperlipidemia    Hypertension    Hypothyroidism    Myocardial infarction [facility]) 2008   placed 2 stents ; had additional stent placed in 2010   Pneumonia 2000   Sleep apnea     Past Surgical History:  Procedure Laterality Date   ANAL FISSURE REPAIR     CARDIAC CATHETERIZATION     LAPAROSCOPIC GASTRIC SLEEVE RESECTION N/A 05/30/2017   Procedure: LAPAROSCOPIC GASTRIC SLEEVE RESECTION, UPPER ENDO;  Surgeon: Greer Pickerel, MD;  Location: [facility];  Service: General;  Laterality: N/A;   NASAL SINUS SURGERY      Current Medications: Current Meds  Medication Sig   aspirin EC 81 MG tablet Take 81 mg by mouth daily.   atorvastatin (LIPITOR) 40 MG tablet Take 80 mg by mouth daily at 6 PM.    carvedilol (COREG) 6.25 MG tablet Take  6.25 mg by mouth 2 (two) times daily with a meal.   cetirizine (ZYRTEC) 10 MG tablet TK 1 T PO QD   Cholecalciferol (VITAMIN D-3) 5000 units TABS Take 5,000 Units by mouth every evening.   clopidogrel (PLAVIX) 75 MG tablet Take 1 tablet (75 mg total) by mouth daily.   Coenzyme Q10 (COQ10) 100 MG CAPS Take 100 mg by mouth daily.   ramipril (ALTACE) 2.5 MG capsule Take 2.5 mg by mouth daily.   UNABLE TO FIND Med Name: Lpratropium 0.06% 72mL as needed   UNABLE TO FIND 500 mg daily. Med Name: Mega Red 500 mg   UNABLE TO FIND Take 500 mg by mouth 3 (three) times daily. Med Name: Calcium Citrate   UNABLE TO FIND Take by  mouth daily. Med Name: Bariatric Advantage   vitamin E 400 UNIT capsule Take 400 Units by mouth daily.     Allergies:   Cortisone and Prednisone   Social History   Socioeconomic History   Marital status: Married    Spouse name: Not on file   Number of children: Not on file   Years of education: Not on file   Highest education level: Not on file  Occupational History   Not on file  Social Needs   Financial resource strain: Not on file   Food insecurity    Worry: Not on file    Inability: Not on file   Transportation needs    Medical: Not on file    Non-medical: Not on file  Tobacco Use   Smoking status: Former Smoker    Types: Cigarettes, Cigars    Quit date: 2008    Years since quitting: 12.5   Smokeless tobacco: Never Used   Tobacco comment: occasionally   Substance and Sexual Activity   Alcohol use: Yes    Alcohol/week: 1.0 standard drinks    Types: 1 Cans of beer per week    Comment: beer occasionally , sometimes a mixed drink    Drug use: No    Comment: occasionally "marijuana gummie" out of town   Sexual activity: Not on file  Lifestyle   Physical activity    Days per week: Not on file    Minutes per session: Not on file   Stress: Not on file  Relationships   Social connections    Talks on phone: Not on file    Gets together: Not on file    Attends religious service: Not on file    Active member of club or organization: Not on file    Attends meetings of clubs or organizations: Not on file    Relationship status: Not on file  Other Topics Concern   Not on file  Social History Narrative   Not on file     Family History: The patient's family history includes Alcohol abuse in his father; Arthritis in his brother, father, and maternal grandfather; Cancer in his father and another family member; Depression in his mother and sister; Diabetes in his mother and another family member; Drug abuse in his sister; Early death in his sister; Heart  attack in his brother, father, and maternal grandmother; Heart disease in his sister; Hyperlipidemia in his sister; Hypertension in his sister and another family member; Kidney disease in his mother; Miscarriages / [place] in his mother.  ROS:   Please see the history of present illness.    All other systems reviewed and are negative.  EKGs/Labs/Other Studies Reviewed:    The following studies  were reviewed today: I reviewed records including records from the other cardiology group and coronary angiography notes mentioned by his previous cardiologist at length EKG reveals sinus rhythm and nonspecific ST-T changes.   Recent Labs: 06/20/2019: ALT 48; BUN 18; Creatinine, Ser 0.93; Hemoglobin 14.9; Platelets 111.0; Potassium 4.6; Sodium 139; TSH 1.09  Recent Lipid Panel No results found for: CHOL, TRIG, HDL, CHOLHDL, VLDL, LDLCALC, LDLDIRECT  Physical Exam:    VS:  BP 122/84    Pulse 60    Ht 5\' 11"  (1.803 m)    Wt 296 lb 12.8 oz (134.6 kg)    SpO2 98%    BMI 41.40 kg/m     Wt Readings from Last 3 Encounters:  06/25/19 296 lb 12.8 oz (134.6 kg)  06/20/19 292 lb (132.5 kg)  01/03/19 286 lb (129.7 kg)     GEN: Patient is in no acute distress HEENT: Normal NECK: No JVD; No carotid bruits LYMPHATICS: No lymphadenopathy CARDIAC: S1 S2 regular, 2/6 systolic murmur at the apex. RESPIRATORY:  Clear to auscultation without rales, wheezing or rhonchi  ABDOMEN: Soft, non-tender, non-distended MUSCULOSKELETAL:  No edema; No deformity  SKIN: Warm and dry NEUROLOGIC:  Alert and oriented x 3 PSYCHIATRIC:  Normal affect    Signed, Jenean Lindau, MD  06/25/2019 10:48 AM    [place]

## 2019-06-25 NOTE — Patient Instructions (Signed)
Medication Instructions:  Your physician recommends that you continue on your current medications as directed. Please refer to the Current Medication list given to you today.  If you need a refill on your cardiac medications before your next appointment, please call your pharmacy.   Lab work: NONE If you have labs (blood work) drawn today and your tests are completely normal, you will receive your results only by: Marland Kitchen MyChart Message (if you have MyChart) OR . A paper copy in the mail If you have any lab test that is abnormal or we need to change your treatment, we will call you to review the results.  Testing/Procedures: Echocardiogram  Follow-Up: At [facility], you and your health needs are our priority.  As part of our continuing mission to provide you with exceptional heart care, we have created designated Provider Care Teams.  These Care Teams include your primary Cardiologist (physician) and Advanced Practice Providers (APPs -  Physician Assistants and Nurse Practitioners) who all work together to provide you with the care you need, when you need it. . You will need a follow up appointment in 6 months.  Please call our office 2 months in advance to schedule this appointment.   Any Other Special Instructions Will Be Listed Below (If Applicable). NONE

## 2019-06-26 ENCOUNTER — Encounter: Payer: Self-pay | Admitting: Family Medicine

## 2019-06-27 ENCOUNTER — Encounter: Payer: Self-pay | Admitting: Family Medicine

## 2019-07-02 ENCOUNTER — Ambulatory Visit (HOSPITAL_BASED_OUTPATIENT_CLINIC_OR_DEPARTMENT_OTHER)
Admission: RE | Admit: 2019-07-02 | Discharge: 2019-07-02 | Disposition: A | Discharge status: Home / Self Care | Payer: BC Managed Care – PPO | Source: Ambulatory Visit | Attending: Cardiology | Admitting: Cardiology

## 2019-07-02 ENCOUNTER — Other Ambulatory Visit: Payer: Self-pay

## 2019-07-02 DIAGNOSIS — I10 Essential (primary) hypertension: Secondary | ICD-10-CM | POA: Diagnosis not present

## 2019-07-02 DIAGNOSIS — I2510 Atherosclerotic heart disease of native coronary artery without angina pectoris: Secondary | ICD-10-CM | POA: Insufficient documentation

## 2019-07-02 DIAGNOSIS — E7800 Pure hypercholesterolemia, unspecified: Secondary | ICD-10-CM | POA: Diagnosis not present

## 2019-07-02 DIAGNOSIS — I252 Old myocardial infarction: Secondary | ICD-10-CM

## 2019-07-02 NOTE — Progress Notes (Signed)
  Echocardiogram 2D Echocardiogram has been performed.  Scott Bauer 07/02/2019, 11:25 AM

## 2019-07-04 ENCOUNTER — Telehealth: Payer: Self-pay | Admitting: *Deleted

## 2019-07-04 ENCOUNTER — Encounter: Payer: Self-pay | Admitting: *Deleted

## 2019-07-04 NOTE — Telephone Encounter (Signed)
-----   Message from Jenean Lindau, MD sent at 07/02/2019 12:44 PM EDT ----- The results of the study is unremarkable. Please inform patient. I will discuss in detail at next appointment. Cc  primary care/referring physician Jenean Lindau, MD 07/02/2019 12:44 PM

## 2019-07-04 NOTE — Telephone Encounter (Signed)
Telephone call to patient.Left message that echo results were unremarkable and to call with any questions. Copy sent to PCP.

## 2019-07-18 DIAGNOSIS — Z955 Presence of coronary angioplasty implant and graft: Secondary | ICD-10-CM | POA: Diagnosis not present

## 2019-07-18 DIAGNOSIS — I2510 Atherosclerotic heart disease of native coronary artery without angina pectoris: Secondary | ICD-10-CM | POA: Diagnosis not present

## 2019-07-18 DIAGNOSIS — G4733 Obstructive sleep apnea (adult) (pediatric): Secondary | ICD-10-CM | POA: Diagnosis not present

## 2019-07-18 DIAGNOSIS — J310 Chronic rhinitis: Secondary | ICD-10-CM | POA: Diagnosis not present

## 2019-08-08 DIAGNOSIS — L821 Other seborrheic keratosis: Secondary | ICD-10-CM | POA: Diagnosis not present

## 2019-08-08 DIAGNOSIS — D225 Melanocytic nevi of trunk: Secondary | ICD-10-CM | POA: Diagnosis not present

## 2019-08-08 DIAGNOSIS — L819 Disorder of pigmentation, unspecified: Secondary | ICD-10-CM | POA: Diagnosis not present

## 2019-08-08 DIAGNOSIS — L814 Other melanin hyperpigmentation: Secondary | ICD-10-CM | POA: Diagnosis not present

## 2019-08-08 DIAGNOSIS — L820 Inflamed seborrheic keratosis: Secondary | ICD-10-CM | POA: Diagnosis not present

## 2019-09-27 DIAGNOSIS — M9902 Segmental and somatic dysfunction of thoracic region: Secondary | ICD-10-CM | POA: Diagnosis not present

## 2019-09-27 DIAGNOSIS — M542 Cervicalgia: Secondary | ICD-10-CM | POA: Diagnosis not present

## 2019-11-01 ENCOUNTER — Other Ambulatory Visit: Payer: Self-pay | Admitting: *Deleted

## 2019-11-01 ENCOUNTER — Encounter: Payer: Self-pay | Admitting: Family Medicine

## 2019-11-01 DIAGNOSIS — Z1211 Encounter for screening for malignant neoplasm of colon: Secondary | ICD-10-CM

## 2019-11-14 DIAGNOSIS — G4733 Obstructive sleep apnea (adult) (pediatric): Secondary | ICD-10-CM | POA: Diagnosis not present

## 2019-11-22 ENCOUNTER — Telehealth: Payer: Self-pay | Admitting: Gastroenterology

## 2019-11-22 NOTE — Telephone Encounter (Signed)
Received previous colonoscopy report dated 11/26/2014, completed by Dr. Margaretmary Bayley, MD.  Colonoscopy completed for personal history of colon polyps.  Bowel prep was good.  Moderate diverticulosis of the left colon, medium size internal hemorrhoids, otherwise normal.  Recommended repeat in 5 years given prior history of colon polyps.  Chart briefly reviewed.  Medical history n/f obesity (BMI 41.4), CAD, hypertension, hyperlipidemia, previous MI/PCI, diabetes.  Follows in the Cardiology clinic, last seen in 06/2019.  On Plavix 75 mg/day and ASA 81 mg/day.  Recommendations: -Schedule appointment with me in clinic.  Will review scheduling repeat colonoscopy for ongoing polyp surveillance, along with management of antiplatelet therapy in the perioperative setting.

## 2019-11-26 NOTE — Telephone Encounter (Signed)
LMOM for patient to call back.

## 2019-11-28 ENCOUNTER — Encounter: Payer: Self-pay | Admitting: Gastroenterology

## 2019-11-28 NOTE — Telephone Encounter (Signed)
Spoke to patient. Scheduled an appointment for 12/18/19. New patient paperwork mailed to patient.

## 2019-11-30 ENCOUNTER — Encounter: Payer: Self-pay | Admitting: Family Medicine

## 2019-11-30 MED ORDER — LEVOTHYROXINE SODIUM 200 MCG PO TABS: 200.0000 ug | ORAL_TABLET | Freq: Every day | ORAL | 3 refills | Status: DC

## 2019-11-30 NOTE — Addendum Note (Signed)
Addended by: Lamar Blinks C on: 11/30/2019 03:32 PM   Modules accepted: Orders

## 2019-11-30 NOTE — Telephone Encounter (Signed)
Not on current medication list please advise.

## 2019-12-03 ENCOUNTER — Other Ambulatory Visit: Payer: Self-pay

## 2019-12-03 MED ORDER — CLOPIDOGREL BISULFATE 75 MG PO TABS: 75.0000 mg | ORAL_TABLET | Freq: Every day | ORAL | 0 refills | Status: DC

## 2019-12-11 ENCOUNTER — Encounter (HOSPITAL_COMMUNITY): Payer: Self-pay

## 2019-12-18 ENCOUNTER — Ambulatory Visit (INDEPENDENT_AMBULATORY_CARE_PROVIDER_SITE_OTHER): Payer: BC Managed Care – PPO | Admitting: Gastroenterology

## 2019-12-18 ENCOUNTER — Encounter: Payer: Self-pay | Admitting: Gastroenterology

## 2019-12-18 ENCOUNTER — Other Ambulatory Visit: Payer: Self-pay

## 2019-12-18 ENCOUNTER — Telehealth: Payer: Self-pay

## 2019-12-18 VITALS — BP 118/66 | HR 80 | Temp 97.5°F | Ht 71.0 in | Wt 307.0 lb

## 2019-12-18 DIAGNOSIS — Z7902 Long term (current) use of antithrombotics/antiplatelets: Secondary | ICD-10-CM

## 2019-12-18 DIAGNOSIS — Z86010 Personal history of colonic polyps: Secondary | ICD-10-CM

## 2019-12-18 DIAGNOSIS — Z9989 Dependence on other enabling machines and devices: Secondary | ICD-10-CM

## 2019-12-18 DIAGNOSIS — Z8719 Personal history of other diseases of the digestive system: Secondary | ICD-10-CM | POA: Diagnosis not present

## 2019-12-18 DIAGNOSIS — Z5181 Encounter for therapeutic drug level monitoring: Secondary | ICD-10-CM | POA: Diagnosis not present

## 2019-12-18 DIAGNOSIS — Z01818 Encounter for other preprocedural examination: Secondary | ICD-10-CM | POA: Diagnosis not present

## 2019-12-18 DIAGNOSIS — I2510 Atherosclerotic heart disease of native coronary artery without angina pectoris: Secondary | ICD-10-CM

## 2019-12-18 DIAGNOSIS — G4733 Obstructive sleep apnea (adult) (pediatric): Secondary | ICD-10-CM

## 2019-12-18 MED ORDER — CLENPIQ 10-3.5-12 MG-GM -GM/160ML PO SOLN: 1.0000 | Freq: Once | ORAL | 0 refills | Status: AC

## 2019-12-18 NOTE — Telephone Encounter (Signed)
Dr. Geraldo Pitter, this pt has a history of prox RCA and LAD stents in 2008. [place] 2014 with DES to RCA with chronic severe stenosis of Cx/OM with normal LV function. Previously pt of [facility]. He established with you on 06/25/2019.   Echo with normal EF in 06/2019.  He needs colonoscopy. Can Plavix be held for 5 for procedure?  Please route response back to P CV DIV PREOP.  Thanks, Gae Bon

## 2019-12-18 NOTE — Telephone Encounter (Signed)
[facility] Pre-operative Risk Assessment     Request for surgical clearance:     Endoscopy Procedure  What type of surgery is being performed?     colonoscopy  When is this surgery scheduled?     01/04/20  What type of clearance is required ?   Pharmacy  Are there any medications that need to be held prior to surgery and how long?  Plavix 5 days  Practice name and name of physician performing surgery?      [facility]  What is your office phone and fax number?      Phone- [phone]  [phone]  Anesthesia type (None, local, MAC, general) ?       MAC

## 2019-12-18 NOTE — Telephone Encounter (Signed)
If the patient stent is more than a year ago Plavix can be withheld and patient will need to continue aspirin 81 mg coated on a daily basis.

## 2019-12-18 NOTE — Telephone Encounter (Signed)
Spoke to patient to inform him that his cardiologist cleared him to hold Plavix 5 days prior to colonoscopy. He will continue Aspirin 81 mg daily. All questions answered. Patient voiced understanding.

## 2019-12-18 NOTE — Progress Notes (Signed)
Chief Complaint: Ongoing colon cancer screening/polyp surveillance  Referring Provider:     Darreld Mclean, MD   HPI:    Scott Bauer is a 64 y.o. male with a history of diabetes, obesity (BMI 42.8), hypertension, hyperlipidemia, hypothyroidism, CAD (PCI 2010; on Plavix/ASA), OSA (on CPAP), Gastric Sleeve in 2018, referred to the [facility]ology [facility] for evaluation of ongoing polyp surveillance.  Last colonoscopy completed in 2016 as outlined below.  No polyps noted at that time, but owing to prior history of polyps, was recommended repeat in 5 years.  He is otherwise without GI symptoms. Hx of external hemorrhoids s/p surgery 7-8 years ago without any recurrence. No longer taking supplemental fiber since gastric sleeve 2.5 years ago.  Internal hemorrhoids noted at time of last colonoscopy, but denies any associated symptoms.  Otherwise tolerating p.o. intake without issue and no complaints today.  Most recent labs from 05/2019 reviewed: Normal CMP, PLT 111 otherwise normal CBC  Endoscopic history: -Colonoscopy (11/2014, Dr. Margaretmary Bayley; ongoing surveillance for personal history of colon polyps): Bowel prep was good.  Moderate diverticulosis of the left colon, medium size internal hemorrhoids, otherwise normal.  Recommended repeat in 5 years given prior history of colon polyps. - Colonoscopy approx 2011 with 3 polyps, with recommendation to rpeat in 5 years. No report available for review   Past Medical History:  Diagnosis Date  . Allergy   . Anal fissure   . Colon polyp   . Diabetes mellitus without complication ([place])    type 2   . Fatty liver   . Heart disease   . Hyperlipidemia   . Hypertension   . Hypothyroidism   . Myocardial infarction [facility]) 2008   placed 2 stents ; had additional stent placed in 2010  . Pneumonia 2000  . Sleep apnea    on CPAP machine     Past Surgical History:  Procedure Laterality Date  . ANAL FISSURE REPAIR    . CARDIAC  CATHETERIZATION    . LAPAROSCOPIC GASTRIC SLEEVE RESECTION N/A 05/30/2017   Procedure: LAPAROSCOPIC GASTRIC SLEEVE RESECTION, UPPER ENDO;  Surgeon: Greer Pickerel, MD;  Location: [facility];  Service: General;  Laterality: N/A;  . NASAL SINUS SURGERY     Family History  Problem Relation Age of Onset  . Cancer Other   . Hypertension Other   . Diabetes Other   . Depression Mother   . Diabetes Mother   . Kidney disease Mother   . Miscarriages / [place] Mother   . Alcohol abuse Father   . Arthritis Father   . Cancer Father   . Heart attack Father   . Heart disease Sister   . Hyperlipidemia Sister   . Hypertension Sister   . Depression Sister   . Drug abuse Sister   . Early death Sister   . Arthritis Brother   . Heart attack Brother   . Heart attack Maternal Grandmother   . Arthritis Maternal Grandfather    Social History   Tobacco Use  . Smoking status: Current Some Day Smoker    Types: Cigarettes, Cigars    Last attempt to quit: 2008    Years since quitting: 13.0  . Smokeless tobacco: Never Used  . Tobacco comment: occasionally   Substance Use Topics  . Alcohol use: Yes    Alcohol/week: 1.0 standard drinks    Types: 1 Cans of beer per week    Comment: beer  occasionally , sometimes a mixed drink   . Drug use: No    Comment: occasionally "marijuana gummie" out of town   Current Outpatient Medications  Medication Sig Dispense Refill  . aspirin EC 81 MG tablet Take 81 mg by mouth daily.    Marland Kitchen atorvastatin (LIPITOR) 40 MG tablet Take 80 mg by mouth daily at 6 PM.     . carvedilol (COREG) 6.25 MG tablet Take 6.25 mg by mouth 2 (two) times daily with a meal.    . cetirizine (ZYRTEC) 10 MG tablet TK 1 T PO QD    . Cholecalciferol (VITAMIN D-3) 5000 units TABS Take 5,000 Units by mouth every evening.    . clopidogrel (PLAVIX) 75 MG tablet Take 1 tablet (75 mg total) by mouth daily. 30 tablet 0  . Coenzyme Q10 (COQ10) 100 MG CAPS Take 100 mg by mouth daily.    Marland Kitchen levothyroxine  (SYNTHROID) 200 MCG tablet Take 1 tablet (200 mcg total) by mouth daily before breakfast. 90 tablet 3  . ramipril (ALTACE) 2.5 MG capsule Take 2.5 mg by mouth daily.    Marland Kitchen UNABLE TO FIND Med Name: Lpratropium 0.06% 61mL as needed    . UNABLE TO FIND 500 mg daily. Med Name: Mega Red 500 mg    . UNABLE TO FIND Take 500 mg by mouth 3 (three) times daily. Med Name: Calcium Citrate    . UNABLE TO FIND Take by mouth daily. Med Name: Bariatric Advantage    . vitamin E 400 UNIT capsule Take 400 Units by mouth daily.    . nitroGLYCERIN (NITROSTAT) 0.4 MG SL tablet as needed.     No current facility-administered medications for this visit.   Allergies  Allergen Reactions  . Cortisone   . Prednisone Nausea Only and Other (See Comments)    Makes him feel funny  Only has issues when takes dose packs     Review of Systems: All systems reviewed and negative except where noted in HPI.     Physical Exam:    Wt Readings from Last 3 Encounters:  12/18/19 (!) 307 lb (139.3 kg)  06/25/19 296 lb 12.8 oz (134.6 kg)  06/20/19 292 lb (132.5 kg)    BP 118/66   Pulse 80   Temp (!) 97.5 F (36.4 C)   Ht 5\' 11"  (1.803 m)   Wt (!) 307 lb (139.3 kg)   BMI 42.82 kg/m  Constitutional:  Pleasant, in no acute distress. Psychiatric: Normal mood and affect. Behavior is normal. EENT: Pupils normal.  Conjunctivae are normal. No scleral icterus. Neck supple. No cervical LAD. Cardiovascular: Normal rate, regular rhythm. No edema Pulmonary/chest: Effort normal and breath sounds normal. No wheezing, rales or rhonchi. Abdominal: Soft, nondistended, nontender. Bowel sounds active throughout. There are no masses palpable. No hepatomegaly. Neurological: Alert and oriented to person place and time. Skin: Skin is warm and dry. No rashes noted.   ASSESSMENT AND PLAN;   1) History of colon polyps -History of colon polyps on colonoscopy in approximately 2011.  Repeat colonoscopy in 2016 without polyps, but  recommended repeat in 5 years. -Schedule repeat colonoscopy.  Recommendations for ongoing surveillance intervals pending results of this study -Given history of colon polyps, recommending Plavix hold as outlined below.  ASA 81 mg okay to resume through the perioperative period  2) History of hemorrhoids: -External hemorrhoid surgery without recurrence -Evaluate for presence, grade, severity of internal hemorrhoids at time of colonoscopy.  3) History of CAD 4) Chronic antiplatelet therapy -  Hold Plavix 5 days before procedure - will instruct when and how to resume after procedure. Low but real risk of cardiovascular event such as heart attack, stroke, embolism, thrombosis or ischemia/infarct of other organs off Plavix explained and need to seek urgent help if this occurs. The patient consents to proceed. Will communicate by phone or EMR with patient's prescribing provider to confirm that holding Plavix is reasonable in this case -Resume ASA 81 mg  5) Obesity (BMI 42.8) 6) OSA (on CPAP) -Elevated risk for colonoscopy reviewed  The indications, risks, and benefits of colonoscopy were explained to the patient in detail. Risks include but are not limited to bleeding, perforation, adverse reaction to medications, and cardiopulmonary compromise. Sequelae include but are not limited to the possibility of surgery, hospitalization, and mortality. The patient verbalized understanding and wished to proceed. All questions answered, referred to the scheduler and bowel prep ordered. Further recommendations pending results of the exam.    Lavena Bullion, DO, FACG  12/18/2019, 9:39 AM   [place], Gay Filler, MD

## 2019-12-18 NOTE — Patient Instructions (Signed)
You have been scheduled for a colonoscopy. Please follow written instructions given to you at your visit today.  Please pick up your prep supplies at the pharmacy within the next 1-3 days. If you use inhalers (even only as needed), please bring them with you on the day of your procedure. Your physician has requested that you go to www.startemmi.com and enter the access code given to you at your visit today. This web site gives a general overview about your procedure. However, you should still follow specific instructions given to you by our office regarding your preparation for the procedure.  It was a pleasure to see you today!  Vito Cirigliano, D.O.  

## 2019-12-18 NOTE — Telephone Encounter (Signed)
   Primary Cardiologist: Jenean Lindau, MD  Chart reviewed as part of pre-operative protocol coverage. Given past medical history, Plavix can be held for procedure but please continue coated aspirin 81 mg daily.   I will route this recommendation to the requesting party via Epic fax function and remove from pre-op pool.  Please call with questions.  Daune Perch, NP 12/18/2019, 1:40 PM

## 2020-01-01 ENCOUNTER — Other Ambulatory Visit: Payer: Self-pay

## 2020-01-01 ENCOUNTER — Other Ambulatory Visit: Payer: Self-pay | Admitting: Gastroenterology

## 2020-01-01 ENCOUNTER — Ambulatory Visit (INDEPENDENT_AMBULATORY_CARE_PROVIDER_SITE_OTHER): Payer: BC Managed Care – PPO

## 2020-01-01 DIAGNOSIS — Z1159 Encounter for screening for other viral diseases: Secondary | ICD-10-CM

## 2020-01-01 LAB — SARS CORONAVIRUS 2 (TAT 6-24 HRS): SARS Coronavirus 2: NEGATIVE

## 2020-01-03 ENCOUNTER — Other Ambulatory Visit: Payer: Self-pay

## 2020-01-03 ENCOUNTER — Ambulatory Visit (INDEPENDENT_AMBULATORY_CARE_PROVIDER_SITE_OTHER): Payer: BC Managed Care – PPO | Admitting: Cardiology

## 2020-01-03 ENCOUNTER — Encounter: Payer: Self-pay | Admitting: Cardiology

## 2020-01-03 VITALS — BP 110/58 | HR 75 | Temp 98.3°F | Resp 18 | Ht 71.0 in | Wt 304.8 lb

## 2020-01-03 DIAGNOSIS — I10 Essential (primary) hypertension: Secondary | ICD-10-CM

## 2020-01-03 DIAGNOSIS — Z1329 Encounter for screening for other suspected endocrine disorder: Secondary | ICD-10-CM

## 2020-01-03 DIAGNOSIS — E7800 Pure hypercholesterolemia, unspecified: Secondary | ICD-10-CM | POA: Diagnosis not present

## 2020-01-03 DIAGNOSIS — I2510 Atherosclerotic heart disease of native coronary artery without angina pectoris: Secondary | ICD-10-CM

## 2020-01-03 NOTE — Progress Notes (Signed)
Cardiology Office Note:    Date:  01/03/2020   ID:  Scott Bauer, DOB October 14, 1956, MRN [id number]  PCP:  Darreld Mclean, MD  Cardiologist:  Jenean Lindau, MD   Referring MD: Darreld Mclean, MD    ASSESSMENT:    1. Coronary artery disease involving native coronary artery of native heart without angina pectoris   2. Hypercholesterolemia   3. Essential hypertension   4. Morbid obesity (Scott Bauer)    PLAN:    In order of problems listed above:  1. Coronary artery disease: Secondary prevention stressed with the patient.  Importance of compliance with diet and medication stressed and he vocalized understanding.  Patient is a sedentary lifestyle and has gained significant amount of weight.  He is aware of This.  He promises to do better 2. Essential hypertension: Blood pressure is stable and diet was discussed including salt intake issues 3. Mixed dyslipidemia: We will do blood work today and diet was emphasized.  Risks of obesity explained and he understands   Medication Adjustments/Labs and Tests Ordered: Current medicines are reviewed at length with the patient today.  Concerns regarding medicines are outlined above.  No orders of the defined types were placed in this encounter.  No orders of the defined types were placed in this encounter.    Chief Complaint  Patient presents with  . Follow-up    6 month follow up with no c/o.     History of Present Illness:    Scott Bauer is a 64 y.o. male with past medical history coronary artery disease, essential hypertension and dyslipidemia.  He denies any problems at this time and takes care of activities of daily living.  No chest pain orthopnea or PND.  He has been noncompliant with advice regarding diet and leads a sedentary lifestyle.Marland Kitchen  He tells me that personally he is disappointed with himself for this reason.  At the time of my evaluation, the patient is alert awake oriented and in no distress.  Past Medical History:   Diagnosis Date  . Allergy   . Anal fissure   . Colon polyp   . Diabetes mellitus without complication ([place])    type 2   . Fatty liver   . Heart disease   . Hyperlipidemia   . Hypertension   . Hypothyroidism   . Myocardial infarction [facility]) 2008   placed 2 stents ; had additional stent placed in 2010  . Pneumonia 2000  . Sleep apnea    on CPAP machine    Past Surgical History:  Procedure Laterality Date  . ANAL FISSURE REPAIR    . CARDIAC CATHETERIZATION    . LAPAROSCOPIC GASTRIC SLEEVE RESECTION N/A 05/30/2017   Procedure: LAPAROSCOPIC GASTRIC SLEEVE RESECTION, UPPER ENDO;  Surgeon: Greer Pickerel, MD;  Location: [facility];  Service: General;  Laterality: N/A;  . NASAL SINUS SURGERY      Current Medications: Current Meds  Medication Sig  . aspirin EC 81 MG tablet Take 81 mg by mouth daily.  Marland Kitchen atorvastatin (LIPITOR) 80 MG tablet Take 80 mg by mouth daily at 6 PM.   . carvedilol (COREG) 6.25 MG tablet Take 6.25 mg by mouth 2 (two) times daily with a meal.  . cetirizine (ZYRTEC) 10 MG tablet TK 1 T PO QD  . Cholecalciferol (VITAMIN D-3) 5000 units TABS Take 5,000 Units by mouth every evening.  . clopidogrel (PLAVIX) 75 MG tablet Take 1 tablet (75 mg total) by mouth daily.  . Coenzyme  Q10 (COQ10) 100 MG CAPS Take 100 mg by mouth daily.  Marland Kitchen levothyroxine (SYNTHROID) 200 MCG tablet Take 1 tablet (200 mcg total) by mouth daily before breakfast.  . nitroGLYCERIN (NITROSTAT) 0.4 MG SL tablet as needed.  . ramipril (ALTACE) 2.5 MG capsule Take 2.5 mg by mouth daily.  Marland Kitchen UNABLE TO FIND Med Name: Lpratropium 0.06% 105mL as needed  . UNABLE TO FIND 500 mg daily. Med Name: Mega Red 500 mg  . UNABLE TO FIND Take 500 mg by mouth 3 (three) times daily. Med Name: Calcium Citrate  . UNABLE TO FIND Take by mouth daily. Med Name: Bariatric Advantage  . vitamin E 400 UNIT capsule Take 400 Units by mouth daily.     Allergies:   Cortisone and Prednisone   Social History   Socioeconomic History  .  Marital status: Married    Spouse name: Not on file  . Number of children: Not on file  . Years of education: Not on file  . Highest education level: Not on file  Occupational History  . Not on file  Tobacco Use  . Smoking status: Current Some Day Smoker    Types: Cigarettes, Cigars    Last attempt to quit: 2008    Years since quitting: 13.1  . Smokeless tobacco: Never Used  . Tobacco comment: occasionally   Substance and Sexual Activity  . Alcohol use: Yes    Alcohol/week: 1.0 standard drinks    Types: 1 Cans of beer per week    Comment: beer occasionally , sometimes a mixed drink   . Drug use: No    Comment: occasionally "marijuana gummie" out of town  . Sexual activity: Not on file  Other Topics Concern  . Not on file  Social History Narrative  . Not on file   Social Determinants of Health   Financial Resource Strain:   . Difficulty of Paying Living Expenses: Not on file  Food Insecurity:   . Worried About Charity fundraiser in the Last Year: Not on file  . Ran Out of Food in the Last Year: Not on file  Transportation Needs:   . Lack of Transportation (Medical): Not on file  . Lack of Transportation (Non-Medical): Not on file  Physical Activity:   . Days of Exercise per Week: Not on file  . Minutes of Exercise per Session: Not on file  Stress:   . Feeling of Stress : Not on file  Social Connections:   . Frequency of Communication with Friends and Family: Not on file  . Frequency of Social Gatherings with Friends and Family: Not on file  . Attends Religious Services: Not on file  . Active Member of Clubs or Organizations: Not on file  . Attends Archivist Meetings: Not on file  . Marital Status: Not on file     Family History: The patient's family history includes Alcohol abuse in his father; Arthritis in his brother, father, and maternal grandfather; Cancer in his father and another family member; Depression in his mother and sister; Diabetes in his  mother and another family member; Drug abuse in his sister; Early death in his sister; Heart attack in his brother, father, and maternal grandmother; Heart disease in his sister; Hyperlipidemia in his sister; Hypertension in his sister and another family member; Kidney disease in his mother; Miscarriages / [place] in his mother.  ROS:   Please see the history of present illness.    All other systems reviewed and are  negative.  EKGs/Labs/Other Studies Reviewed:    The following studies were reviewed today: I discussed my findings with the patient at extensive length   Recent Labs: 06/20/2019: ALT 48; BUN 18; Creatinine, Ser 0.93; Hemoglobin 14.9; Platelets 111.0; Potassium 4.6; Sodium 139; TSH 1.09  Recent Lipid Panel No results found for: CHOL, TRIG, HDL, CHOLHDL, VLDL, LDLCALC, LDLDIRECT  Physical Exam:    VS:  BP (!) 110/58 (BP Location: Right Arm, Patient Position: Sitting, Cuff Size: Normal)   Pulse 75   Temp 98.3 F (36.8 C)   Resp 18   Ht 5\' 11"  (1.803 m)   Wt (!) 304 lb 12 oz (138.2 kg)   SpO2 97%   BMI 42.50 kg/m     Wt Readings from Last 3 Encounters:  01/03/20 (!) 304 lb 12 oz (138.2 kg)  12/18/19 (!) 307 lb (139.3 kg)  06/25/19 296 lb 12.8 oz (134.6 kg)     GEN: Patient is in no acute distress HEENT: Normal NECK: No JVD; No carotid bruits LYMPHATICS: No lymphadenopathy CARDIAC: Hear sounds regular, 2/6 systolic murmur at the apex. RESPIRATORY:  Clear to auscultation without rales, wheezing or rhonchi  ABDOMEN: Soft, non-tender, non-distended MUSCULOSKELETAL:  No edema; No deformity  SKIN: Warm and dry NEUROLOGIC:  Alert and oriented x 3 PSYCHIATRIC:  Normal affect   Signed, Jenean Lindau, MD  01/03/2020 8:40 AM    [place]

## 2020-01-03 NOTE — Patient Instructions (Signed)
Medication Instructions:  no medication changes *If you need a refill on your cardiac medications before your next appointment, please call your pharmacy*  Lab Work: You had a BMET, CBC, TSH, LFT's and lipids today. If you have labs (blood work) drawn today and your tests are completely normal, you will receive your results only by: Marland Kitchen MyChart Message (if you have MyChart) OR . A paper copy in the mail If you have any lab test that is abnormal or we need to change your treatment, we will call you to review the results.  Testing/Procedures: None ordered  Follow-Up: At [facility], you and your health needs are our priority.  As part of our continuing mission to provide you with exceptional heart care, we have created designated Provider Care Teams.  These Care Teams include your primary Cardiologisno medication changest (physician) and Advanced Practice Providers (APPs -  Physician Assistants and Nurse Practitioners) who all work together to provide you with the care you need, when you need it.  Your next appointment:   6 month(s)  The format for your next appointment:   In Person  Provider:   Jyl Heinz, MD  Other Instructions NA \

## 2020-01-04 ENCOUNTER — Ambulatory Visit (AMBULATORY_SURGERY_CENTER): Payer: BC Managed Care – PPO | Admitting: Gastroenterology

## 2020-01-04 ENCOUNTER — Encounter: Payer: Self-pay | Admitting: Gastroenterology

## 2020-01-04 VITALS — BP 120/72 | HR 63 | Temp 97.5°F | Resp 14 | Ht 71.0 in | Wt 307.0 lb

## 2020-01-04 DIAGNOSIS — D120 Benign neoplasm of cecum: Secondary | ICD-10-CM | POA: Diagnosis not present

## 2020-01-04 DIAGNOSIS — K5730 Diverticulosis of large intestine without perforation or abscess without bleeding: Secondary | ICD-10-CM

## 2020-01-04 DIAGNOSIS — Z1211 Encounter for screening for malignant neoplasm of colon: Secondary | ICD-10-CM | POA: Diagnosis not present

## 2020-01-04 DIAGNOSIS — Z86010 Personal history of colonic polyps: Secondary | ICD-10-CM | POA: Diagnosis not present

## 2020-01-04 DIAGNOSIS — D123 Benign neoplasm of transverse colon: Secondary | ICD-10-CM | POA: Diagnosis not present

## 2020-01-04 DIAGNOSIS — K640 First degree hemorrhoids: Secondary | ICD-10-CM

## 2020-01-04 LAB — TSH: TSH: 0.091 u[IU]/mL — ABNORMAL LOW (ref 0.450–4.500)

## 2020-01-04 LAB — CBC WITH DIFFERENTIAL/PLATELET
Basophils Absolute: 0 10*3/uL (ref 0.0–0.2)
Basos: 1 %
EOS (ABSOLUTE): 0.2 10*3/uL (ref 0.0–0.4)
Eos: 3 %
Hematocrit: 41.1 % (ref 37.5–51.0)
Hemoglobin: 14.6 g/dL (ref 13.0–17.7)
Immature Grans (Abs): 0 10*3/uL (ref 0.0–0.1)
Immature Granulocytes: 0 %
Lymphocytes Absolute: 1.8 10*3/uL (ref 0.7–3.1)
Lymphs: 35 %
MCH: 31.5 pg (ref 26.6–33.0)
MCHC: 35.5 g/dL (ref 31.5–35.7)
MCV: 89 fL (ref 79–97)
Monocytes Absolute: 0.6 10*3/uL (ref 0.1–0.9)
Monocytes: 12 %
Neutrophils Absolute: 2.5 10*3/uL (ref 1.4–7.0)
Neutrophils: 49 %
Platelets: 112 10*3/uL — ABNORMAL LOW (ref 150–450)
RBC: 4.64 x10E6/uL (ref 4.14–5.80)
RDW: 12.8 % (ref 11.6–15.4)
WBC: 5 10*3/uL (ref 3.4–10.8)

## 2020-01-04 LAB — BASIC METABOLIC PANEL
BUN/Creatinine Ratio: 17 (ref 10–24)
BUN: 14 mg/dL (ref 8–27)
CO2: 24 mmol/L (ref 20–29)
Calcium: 9.6 mg/dL (ref 8.6–10.2)
Chloride: 103 mmol/L (ref 96–106)
Creatinine, Ser: 0.83 mg/dL (ref 0.76–1.27)
GFR calc Af Amer: 109 mL/min/{1.73_m2} (ref >59)
GFR calc non Af Amer: 94 mL/min/{1.73_m2} (ref >59)
Glucose: 131 mg/dL — ABNORMAL HIGH (ref 65–99)
Potassium: 4.4 mmol/L (ref 3.5–5.2)
Sodium: 140 mmol/L (ref 134–144)

## 2020-01-04 LAB — LIPID PANEL
Chol/HDL Ratio: 2.5 ratio (ref 0.0–5.0)
Cholesterol, Total: 128 mg/dL (ref 100–199)
HDL: 51 mg/dL (ref >39)
LDL Chol Calc (NIH): 61 mg/dL (ref 0–99)
Triglycerides: 83 mg/dL (ref 0–149)
VLDL Cholesterol Cal: 16 mg/dL (ref 5–40)

## 2020-01-04 LAB — HEPATIC FUNCTION PANEL
ALT: 49 IU/L — ABNORMAL HIGH (ref 0–44)
AST: 28 IU/L (ref 0–40)
Albumin: 4.3 g/dL (ref 3.8–4.8)
Alkaline Phosphatase: 133 IU/L — ABNORMAL HIGH (ref 39–117)
Bilirubin Total: 0.5 mg/dL (ref 0.0–1.2)
Bilirubin, Direct: 0.19 mg/dL (ref 0.00–0.40)
Total Protein: 6.6 g/dL (ref 6.0–8.5)

## 2020-01-04 MED ORDER — SODIUM CHLORIDE 0.9 % IV SOLN: 500.0000 mL | Freq: Once | INTRAVENOUS | Status: DC

## 2020-01-04 NOTE — Progress Notes (Signed)
Report to PACU, RN, vss, BBS= Clear.  

## 2020-01-04 NOTE — Op Note (Signed)
[facility] Patient Name: Scott Bauer Procedure Date: 01/04/2020 8:49 AM MRN: [id number] Endoscopist: Gerrit Heck , MD Age: 64 Referring MD:  Date of Birth: 11-02-1956 Gender: Male Account #: [id number] Procedure:                Colonoscopy Indications:              Surveillance: Personal history of colonic polyps                            (unknown histology) on last colonoscopy 5 years ago                           Colonoscopy x2 at outside facilities, including                            2011 (3 polyps, recommended repeat in 5 years) and                            2016 (diverticulosis, internal hemorrhoids,                            recommended repeat in 5 years). Otherwise, no                            active GI symptoms. Medicines:                Monitored Anesthesia Care Procedure:                Pre-Anesthesia Assessment:                           - Prior to the procedure, a History and Physical                            was performed, and patient medications and                            allergies were reviewed. The patient's tolerance of                            previous anesthesia was also reviewed. The risks                            and benefits of the procedure and the sedation                            options and risks were discussed with the patient.                            All questions were answered, and informed consent                            was obtained. Prior Anticoagulants: The patient has  taken Plavix (clopidogrel), last dose was 7 days                            prior to procedure. ASA Grade Assessment: III - A                            patient with severe systemic disease. After                            reviewing the risks and benefits, the patient was                            deemed in satisfactory condition to undergo the                            procedure.                           After  obtaining informed consent, the colonoscope                            was passed under direct vision. Throughout the                            procedure, the patient's blood pressure, pulse, and                            oxygen saturations were monitored continuously. The                            Colonoscope was introduced through the anus and                            advanced to the the terminal ileum. The colonoscopy                            was technically difficult and complex due to                            inadequate bowel prep. The patient tolerated the                            procedure well. The quality of the bowel                            preparation was fair. The terminal ileum, ileocecal                            valve, appendiceal orifice, and rectum were                            photographed. Scope In: 9:13:08 AM Scope Out: 9:32:08 AM Scope Withdrawal Time: 0 hours 16 minutes 24 seconds  Total Procedure Duration: 0 hours 19 minutes 0  seconds  Findings:                 The perianal and digital rectal examinations were                            normal.                           Three sessile polyps were found in the transverse                            colon and cecum. The polyps were 3 to 5 mm in size.                            These polyps were removed with a cold snare.                            Resection and retrieval were complete. Estimated                            blood loss was minimal.                           Multiple small and large-mouthed diverticula were                            found in the sigmoid colon and transverse colon.                           A moderate amount of semi-solid stool was found                            scattered throughout the entire colon, interfering                            with visualization. Lavage of the area was                            performed using copious amounts of sterile water,                             resulting in clearance with fair visualization.                            Cannot rule out the presence of small or flat                            polyps in these areas that could not be fully                            visualized.                           Non-bleeding internal hemorrhoids were found during  retroflexion. The hemorrhoids were small.                           The terminal ileum appeared normal. Complications:            No immediate complications. Estimated Blood Loss:     Estimated blood loss was minimal. Impression:               - Preparation of the colon was fair.                           - Three 3 to 5 mm polyps in the transverse colon                            and in the cecum, removed with a cold snare.                            Resected and retrieved.                           - Diverticulosis in the sigmoid colon and in the                            transverse colon.                           - Stool in the entire examined colon.                           - Non-bleeding internal hemorrhoids.                           - The examined portion of the ileum was normal. Recommendation:           - Patient has a contact number available for                            emergencies. The signs and symptoms of potential                            delayed complications were discussed with the                            patient. Return to normal activities tomorrow.                            Written discharge instructions were provided to the                            patient.                           - Resume previous diet.                           - Continue present medications.                           -  Await pathology results.                           - Repeat colonoscopy in 2 years because the bowel                            preparation was suboptimal and for surveillance.                           - Return to GI clinic  PRN.                           - Use fiber, for example Citrucel, Fibercon, Konsyl                            or Metamucil. Gerrit Heck, MD 01/04/2020 9:40:25 AM

## 2020-01-04 NOTE — Progress Notes (Signed)
Temp JB VS DT  Pt's states no medical or surgical changes since previsit or office visit. 

## 2020-01-04 NOTE — Patient Instructions (Signed)
YOU HAD AN ENDOSCOPIC PROCEDURE TODAY AT THE [facility]:   Refer to the procedure report that was given to you for any specific questions about what was found during the examination.  If the procedure report does not answer your questions, please call your gastroenterologist to clarify.  If you requested that your care partner not be given the details of your procedure findings, then the procedure report has been included in a sealed envelope for you to review at your convenience later.  YOU SHOULD EXPECT: Some feelings of bloating in the abdomen. Passage of more gas than usual.  Walking can help get rid of the air that was put into your GI tract during the procedure and reduce the bloating. If you had a lower endoscopy (such as a colonoscopy or flexible sigmoidoscopy) you may notice spotting of blood in your stool or on the toilet paper. If you underwent a bowel prep for your procedure, you may not have a normal bowel movement for a few days.  Please Note:  You might notice some irritation and congestion in your nose or some drainage.  This is from the oxygen used during your procedure.  There is no need for concern and it should clear up in a day or so.  SYMPTOMS TO REPORT IMMEDIATELY:   Following lower endoscopy (colonoscopy or flexible sigmoidoscopy):  Excessive amounts of blood in the stool  Significant tenderness or worsening of abdominal pains  Swelling of the abdomen that is new, acute  Fever of 100F or higher  For urgent or emergent issues, a gastroenterologist can be reached at any hour by calling [phone].   DIET:  We do recommend a small meal at first, but then you may proceed to your regular diet.  Drink plenty of fluids but you should avoid alcoholic beverages for 24 hours.  ACTIVITY:  You should plan to take it easy for the rest of today and you should NOT DRIVE or use heavy machinery until tomorrow (because of the sedation medicines used during the test).     FOLLOW UP: Our staff will call the number listed on your records 48-72 hours following your procedure to check on you and address any questions or concerns that you may have regarding the information given to you following your procedure. If we do not reach you, we will leave a message.  We will attempt to reach you two times.  During this call, we will ask if you have developed any symptoms of COVID 19. If you develop any symptoms (ie: fever, flu-like symptoms, shortness of breath, cough etc.) before then, please call [phone].  If you test positive for Covid 19 in the 2 weeks post procedure, please call and report this information to us.    If any biopsies were taken you will be contacted by phone or by letter within the next 1-3 weeks.  Please call us at [phone] if you have not heard about the biopsies in 3 weeks.    SIGNATURES/CONFIDENTIALITY: You and/or your care partner have signed paperwork which will be entered into your electronic medical record.  These signatures attest to the fact that that the information above on your After Visit Summary has been reviewed and is understood.  Full responsibility of the confidentiality of this discharge information lies with you and/or your care-partner. 

## 2020-01-04 NOTE — Progress Notes (Signed)
Called to room to assist during endoscopic procedure.  Patient ID and intended procedure confirmed with present staff. Received instructions for my participation in the procedure from the performing physician.  

## 2020-01-07 ENCOUNTER — Encounter: Payer: Self-pay | Admitting: Family Medicine

## 2020-01-07 ENCOUNTER — Telehealth: Payer: Self-pay | Admitting: Family Medicine

## 2020-01-07 ENCOUNTER — Other Ambulatory Visit: Payer: Self-pay | Admitting: Cardiology

## 2020-01-07 DIAGNOSIS — E890 Postprocedural hypothyroidism: Secondary | ICD-10-CM

## 2020-01-07 MED ORDER — LEVOTHYROXINE SODIUM 150 MCG PO TABS: 150.0000 ug | ORAL_TABLET | Freq: Every day | ORAL | 3 refills | Status: DC

## 2020-01-07 NOTE — Telephone Encounter (Signed)
Patients TSH 0.091Low

## 2020-01-07 NOTE — Telephone Encounter (Signed)
Caller Name: Reuben Likes w/[facility] Phone: [phone]  Reuben Likes wanted to notify Dr. Lorelei Pont that Dr. Geraldo Pitter will be referring pt to f/u with her regarding TSH levels.

## 2020-01-08 ENCOUNTER — Telehealth: Payer: Self-pay

## 2020-01-08 DIAGNOSIS — M542 Cervicalgia: Secondary | ICD-10-CM | POA: Diagnosis not present

## 2020-01-08 DIAGNOSIS — M9902 Segmental and somatic dysfunction of thoracic region: Secondary | ICD-10-CM | POA: Diagnosis not present

## 2020-01-08 DIAGNOSIS — M9903 Segmental and somatic dysfunction of lumbar region: Secondary | ICD-10-CM | POA: Diagnosis not present

## 2020-01-08 DIAGNOSIS — M9901 Segmental and somatic dysfunction of cervical region: Secondary | ICD-10-CM | POA: Diagnosis not present

## 2020-01-08 NOTE — Telephone Encounter (Signed)
  Follow up Call-  Call back number 01/04/2020  Post procedure Call Back phone  # [phone]  Permission to leave phone message Yes  Some recent data might be hidden     Patient questions:  Do you have a fever, pain , or abdominal swelling? No. Pain Score  0 *  Have you tolerated food without any problems? Yes.    Have you been able to return to your normal activities? Yes.    Do you have any questions about your discharge instructions: Diet   No. Medications  No. Follow up visit  No.  Do you have questions or concerns about your Care? No.  Actions: * If pain score is 4 or above: No action needed, pain <4.  1. Have you developed a fever since your procedure? no  2.   Have you had an respiratory symptoms (SOB or cough) since your procedure? no  3.   Have you tested positive for COVID 19 since your procedure no  4.   Have you had any family members/close contacts diagnosed with the COVID 19 since your procedure?  no   If yes to any of these questions please route to Joylene John, RN and Alphonsa Gin, Therapist, sports.

## 2020-01-08 NOTE — Telephone Encounter (Signed)
No answer, left message to call back later today, B.Vivia Rosenburg RN. 

## 2020-01-12 ENCOUNTER — Encounter: Payer: Self-pay | Admitting: Gastroenterology

## 2020-02-01 ENCOUNTER — Other Ambulatory Visit: Payer: Self-pay | Admitting: *Deleted

## 2020-02-01 MED ORDER — ATORVASTATIN CALCIUM 80 MG PO TABS: 80.0000 mg | ORAL_TABLET | Freq: Every day | ORAL | 1 refills | Status: DC

## 2020-02-04 ENCOUNTER — Other Ambulatory Visit: Payer: Self-pay | Admitting: Cardiology

## 2020-03-05 DIAGNOSIS — M9901 Segmental and somatic dysfunction of cervical region: Secondary | ICD-10-CM | POA: Diagnosis not present

## 2020-03-05 DIAGNOSIS — M9903 Segmental and somatic dysfunction of lumbar region: Secondary | ICD-10-CM | POA: Diagnosis not present

## 2020-03-05 DIAGNOSIS — M542 Cervicalgia: Secondary | ICD-10-CM | POA: Diagnosis not present

## 2020-03-05 DIAGNOSIS — M9902 Segmental and somatic dysfunction of thoracic region: Secondary | ICD-10-CM | POA: Diagnosis not present

## 2020-03-20 DIAGNOSIS — Z1152 Encounter for screening for COVID-19: Secondary | ICD-10-CM | POA: Diagnosis not present

## 2020-04-09 ENCOUNTER — Other Ambulatory Visit: Payer: Self-pay

## 2020-04-09 ENCOUNTER — Telehealth (INDEPENDENT_AMBULATORY_CARE_PROVIDER_SITE_OTHER): Payer: BC Managed Care – PPO | Admitting: Medical

## 2020-04-09 VITALS — Temp 98.1°F | Wt 292.0 lb

## 2020-04-09 DIAGNOSIS — J301 Allergic rhinitis due to pollen: Secondary | ICD-10-CM | POA: Diagnosis not present

## 2020-04-09 DIAGNOSIS — R05 Cough: Secondary | ICD-10-CM

## 2020-04-09 DIAGNOSIS — R059 Cough, unspecified: Secondary | ICD-10-CM

## 2020-04-09 MED ORDER — MONTELUKAST SODIUM 10 MG PO TABS: 10.0000 mg | ORAL_TABLET | Freq: Every day | ORAL | 3 refills | Status: DC

## 2020-04-09 MED ORDER — HYDROCODONE-HOMATROPINE 5-1.5 MG/5ML PO SYRP: 5.0000 mL | ORAL_SOLUTION | Freq: Four times a day (QID) | ORAL | 0 refills | Status: DC | PRN

## 2020-04-09 NOTE — Patient Instructions (Signed)
With your history of allergic rhinitis and recent signs/symptoms, I do think you have a flare of allergic rhinitis with associated cough.  Minimal wheezing described.  No obvious infectious signs and symptoms described.  Want you to continue Zyrtec and adding montelukast.  If you feel like you are getting nasal congested then add Flonase nasal spray.  Considered prescribing prednisone to cover allergies and prevent any worsening wheezing but patient notes severe side effects with prednisone in the past.  If the transient minimal wheeze worsens then would prescribe albuterol.  When he described chest congestion or productive cough then would add a azithromycin.  Patient will keep [place] updated.  Follow-up in 7 days or as needed.

## 2020-04-09 NOTE — Progress Notes (Signed)
   Subjective:    Patient ID: Scott Bauer, male    DOB: 1956-08-05, 64 y.o.   MRN: [id number]  HPI   Virtual Visit via Video Note  I connected with Scott Bauer on 04/09/20 at 11:00 AM EDT by a video enabled telemedicine application and verified that I am speaking with the correct person using two identifiers.  Location: Patient: home Provider: office   I discussed the limitations of evaluation and management by telemedicine and the availability of in person appointments. The patient expressed understanding and agreed to proceed.  Pt bp has been controlled but did not check today.  History of Present Illness: Nasal congestion with dry hacking cough. Signs/symptoms for about 5-7 days. No sob but occasional mild wheeze.  No pnd. No obvious eye itch.  Pt is on zyrtec daily. He states year round allergies.  Pt has nasal sprays but has not been using.  No sinus pressure.  Pt states can't sleep due to cough.  No fever, no chills or sweats. No body aches. Pt can smell and test. Pt had 2 moderna vaccines.      Observations/Objective:  General-no acute distress, pleasant, oriented.  Sounds nasally congested to me. Lungs- on inspection lungs appear unlabored. Neck- no tracheal deviation or jvd on inspection. Neuro- gross motor function appears intact. HEENT-no frontal or maxillary sinus pressure per patient.   Assessment and Plan: With your history of allergic rhinitis and recent signs/symptoms, I do think you have a flare of allergic rhinitis with associated cough.  Minimal wheezing described.  No obvious infectious signs and symptoms described.  Want you to continue Zyrtec and adding montelukast.  If you feel like you are getting nasal congested then add Flonase nasal spray.  Considered prescribing prednisone to cover allergies and prevent any worsening wheezing but patient notes severe side effects with prednisone in the past.  If the transient minimal wheeze  worsens then would prescribe albuterol.  When he described chest congestion or productive cough then would add a azithromycin.  Patient will keep [place] updated.  Follow-up in 7 days or as needed.    Time spent with patient today was 25  minutes which consisted of chart review, discussing diagnosis, work up ,treatment and documentation.  Follow Up Instructions:    I discussed the assessment and treatment plan with the patient. The patient was provided an opportunity to ask questions and all were answered. The patient agreed with the plan and demonstrated an understanding of the instructions.   The patient was advised to call back or seek an in-person evaluation if the symptoms worsen or if the condition fails to improve as anticipated.     Aliceson Dolbow, PA-C  Pt in for nasal congestion and dry hacking cough.    Review of Systems     Objective:   Physical Exam        Assessment & Plan:

## 2020-04-24 ENCOUNTER — Other Ambulatory Visit: Payer: Self-pay | Admitting: Family Medicine

## 2020-04-24 MED ORDER — AMOXICILLIN-POT CLAVULANATE 875-125 MG PO TABS: 1.0000 | ORAL_TABLET | Freq: Two times a day (BID) | ORAL | 0 refills | Status: DC

## 2020-04-24 NOTE — Progress Notes (Signed)
Virtual visit with patient's wife today, they both been sick with an upper respiratory infection for the recent past.  Scott Bauer has been sick for about 3 weeks.  He is nontoxic, I did an E visit previously.  I have given his wife antibiotics, she wonders if Kassius can have a prescription as well.  I think at this point that would be fine.  He has had his Covid series, I did encourage them both to get a Covid swab nevertheless to be sure  Rx for Augmentin sent to drugstore for patient They will let me know if not feeling better soon

## 2020-05-14 ENCOUNTER — Encounter: Payer: Self-pay | Admitting: Family Medicine

## 2020-05-14 MED ORDER — LEVOTHYROXINE SODIUM 150 MCG PO TABS: 150.0000 ug | ORAL_TABLET | Freq: Every day | ORAL | 1 refills | Status: DC

## 2020-05-19 ENCOUNTER — Encounter: Payer: Self-pay | Admitting: Family Medicine

## 2020-05-19 MED ORDER — HYDROCODONE-HOMATROPINE 5-1.5 MG/5ML PO SYRP: 5.0000 mL | ORAL_SOLUTION | Freq: Four times a day (QID) | ORAL | 0 refills | Status: DC | PRN

## 2020-05-19 NOTE — Addendum Note (Signed)
Addended by: Lamar Blinks C on: 05/19/2020 08:29 PM   Modules accepted: Orders

## 2020-05-20 DIAGNOSIS — H1045 Other chronic allergic conjunctivitis: Secondary | ICD-10-CM | POA: Diagnosis not present

## 2020-05-20 DIAGNOSIS — J3089 Other allergic rhinitis: Secondary | ICD-10-CM | POA: Diagnosis not present

## 2020-05-20 NOTE — Progress Notes (Addendum)
[facility], [place], [place] [address] [phone]  Date:  05/21/2020   Name:  Scott Bauer   DOB:  [birth year]   MRN:  [id number]  PCP:  Scott Mclean, MD    Chief Complaint: Flu like symptoms (chills, cough, wheezing, congestion, green drainage, sore throat, body aches, no fever)   History of Present Illness:  Scott Bauer is a 64 y.o. very pleasant male patient who presents with the following:  Pt seen today with concern of illness History of MI, HTN, OSA, hyperlipidemia, DM, hypothymism S/p PCI in 2008 and 2010 Gastric sleeve about 3 years ago- he lost quite a bit of weight  I called him in Augmentin on 6/3 when he and his wife were both sick with URI- at that time he had been ill for 3 weeks.   He completed this course, but recently contacted me again with concern of illness which started after he was exposed to his sick granddaughter.  This was a little over a week ago He notes that his wife is also sick again, she will send me a MyChart message with more details  He is sneezing and coughing, notes a ST No fever noted  He has noted some matting in his eyes, he feels like his teeth hurt  He notes sinus pressure and pain, thinks he may have a sinus infection  Former smoker- he quit years ago    Lab Results  Component Value Date   HGBA1C 5.8 06/20/2019   Lab Results  Component Value Date   TSH 0.091 (L) 01/03/2020    Cardiologist is Dr. Cyndi Lennert visit in February COVID-19 series complete  Eye exam He had labs drawn in February but no A1c.  Can offer A1c today  Lipitor 80 Carvedilol Plavix Aspirin 81 Synthroid Singulair  Patient Active Problem List   Diagnosis Date Noted  . Postablative hypothyroidism 01/03/2019  . Diet-controlled diabetes mellitus ([place]) 01/03/2019  . Idiopathic thrombocytopenia ([place]) 01/03/2019  . History of MI (myocardial infarction) 01/03/2019   . Erectile dysfunction 04/09/2018  . Rhinitis, chronic 07/11/2017  . History of right coronary artery stent placement 07/01/2017  . H/O bariatric surgery 06/08/2017  . Obesity, Class III, BMI 40-49.9 (morbid obesity) ([place]) 05/30/2017  . Diabetes mellitus type 2 in obese ([place]) 05/30/2017  . Essential hypertension 05/30/2017  . OSA on CPAP 05/30/2017  . Hypercholesterolemia 05/30/2017  . Morbid obesity ([place]) 05/30/2017  . Acquired hypothyroidism 12/16/2015  . Family history of abdominal aortic aneurysm (AAA) 12/16/2015  . Testicular hypofunction 12/16/2015  . Thrombocytopenia, unspecified ([place]) 12/16/2015  . Coronary artery disease involving native coronary artery of native heart without angina pectoris 05/22/2015  . Old MI (myocardial infarction) 05/22/2015    Past Medical History:  Diagnosis Date  . Allergy   . Anal fissure   . Colon polyp   . Diabetes mellitus without complication ([place])    type 2  on no meds  . Fatty liver   . Heart disease   . Hyperlipidemia   . Hypertension   . Hypothyroidism   . Myocardial infarction [facility]) 2008   placed 2 stents ; had additional stent placed in 2010  . Pneumonia 2000  . Sleep apnea    on CPAP machine    Past Surgical History:  Procedure Laterality Date  . ANAL FISSURE REPAIR    . CARDIAC CATHETERIZATION    . COLOSTOMY  2015  .  LAPAROSCOPIC GASTRIC SLEEVE RESECTION N/A 05/30/2017   Procedure: LAPAROSCOPIC GASTRIC SLEEVE RESECTION, UPPER ENDO;  Surgeon: Greer Pickerel, MD;  Location: [facility] ORS;  Service: General;  Laterality: N/A;  . NASAL SINUS SURGERY      Social History   Tobacco Use  . Smoking status: Current Some Day Smoker    Types: Cigarettes, Cigars    Last attempt to quit: 2008    Years since quitting: 13.5  . Smokeless tobacco: Never Used  . Tobacco comment: occasionally   Vaping Use  . Vaping Use: Never used  Substance Use Topics  . Alcohol use: Yes    Alcohol/week: 1.0 standard drink    Types: 1 Cans of beer per  week    Comment: beer occasionally , sometimes a mixed drink   . Drug use: No    Comment: occasionally "marijuana gummie" out of town    Family History  Problem Relation Age of Onset  . Cancer Other   . Hypertension Other   . Diabetes Other   . Depression Mother   . Diabetes Mother   . Kidney disease Mother   . Miscarriages / [place] Mother   . Alcohol abuse Father   . Arthritis Father   . Cancer Father   . Heart attack Father   . Heart disease Sister   . Hyperlipidemia Sister   . Hypertension Sister   . Depression Sister   . Drug abuse Sister   . Early death Sister   . Arthritis Brother   . Heart attack Brother   . Heart attack Maternal Grandmother   . Arthritis Maternal Grandfather   . Colon cancer Neg Hx   . Colon polyps Neg Hx   . Esophageal cancer Neg Hx   . Stomach cancer Neg Hx   . Rectal cancer Neg Hx     Allergies  Allergen Reactions  . Cortisone   . Prednisone Nausea Only and Other (See Comments)    Makes him feel funny  Only has issues when takes dose packs    Medication list has been reviewed and updated.  Current Outpatient Medications on File Prior to Visit  Medication Sig Dispense Refill  . aspirin EC 81 MG tablet Take 81 mg by mouth daily.    Marland Kitchen atorvastatin (LIPITOR) 80 MG tablet Take 1 tablet (80 mg total) by mouth daily at 6 PM. 90 tablet 1  . carvedilol (COREG) 6.25 MG tablet Take 6.25 mg by mouth 2 (two) times daily with a meal.    . cetirizine (ZYRTEC) 10 MG tablet TK 1 T PO QD    . Cholecalciferol (VITAMIN D-3) 5000 units TABS Take 5,000 Units by mouth every evening.    . clopidogrel (PLAVIX) 75 MG tablet TAKE 1 TABLET(75 MG) BY MOUTH DAILY 90 tablet 2  . Coenzyme Q10 (COQ10) 100 MG CAPS Take 100 mg by mouth daily.    Marland Kitchen HYDROcodone-homatropine (HYCODAN) 5-1.5 MG/5ML syrup Take 5 mLs by mouth every 6 (six) hours as needed. 80 mL 0  . levothyroxine (SYNTHROID) 150 MCG tablet Take 1 tablet (150 mcg total) by mouth daily before breakfast.  30 tablet 1  . montelukast (SINGULAIR) 10 MG tablet Take 1 tablet (10 mg total) by mouth at bedtime. 30 tablet 3  . nitroGLYCERIN (NITROSTAT) 0.4 MG SL tablet as needed.    . ramipril (ALTACE) 2.5 MG capsule Take 2.5 mg by mouth daily.    Marland Kitchen UNABLE TO FIND Med Name: Lpratropium 0.06% 66mL as needed    .  UNABLE TO FIND 500 mg daily. Med Name: Mega Red 500 mg    . UNABLE TO FIND Take 500 mg by mouth 3 (three) times daily. Med Name: Calcium Citrate    . UNABLE TO FIND Take by mouth daily. Med Name: Bariatric Advantage    . vitamin E 400 UNIT capsule Take 400 Units by mouth daily.     No current facility-administered medications on file prior to visit.    Review of Systems:  As per HPI- otherwise negative.   Physical Examination: Vitals:   05/21/20 1500  BP: 124/70  Pulse: 74  Resp: 18  Temp: 98.8 F (37.1 C)  SpO2: 98%   Vitals:   05/21/20 1500  Weight: 297 lb (134.7 kg)  Height: 5\' 11"  (1.803 m)   Body mass index is 41.42 kg/m. Ideal Body Weight: Weight in (lb) to have BMI = 25: 178.9  GEN: no acute distress.  Obese, occasionally coughing.  Otherwise looks well HEENT: Atraumatic, Normocephalic.   Bilateral TM wnl, oropharynx normal.  PEERL,EOMI.   Ears and Nose: No external deformity.  Sinuses are tender to percussion CV: RRR, No M/G/R. No JVD. No thrill. No extra heart sounds. PULM: CTA B, no wheezes, crackles, rhonchi. No retractions. No resp. distress. No accessory muscle use. ABD: S, NT, ND, +BS. No rebound. No HSM. EXTR: No c/c/e PSYCH: Normally interactive. Conversant.    Assessment and Plan: Diet-controlled diabetes mellitus ([place]) - Plan: Hemoglobin A1c  Postablative hypothyroidism - Plan: TSH  Screening for HIV (human immunodeficiency virus) - Plan: HIV Antibody (routine testing w rflx)  Encounter for hepatitis C screening test for low risk patient - Plan: Hepatitis C antibody  Acute non-recurrent frontal sinusitis - Plan: amoxicillin (AMOXIL) 500 MG  capsule  Here today with concern of recurrent illness and likely sinus infection.  We will treat with amoxicillin for 10 days-he is asked to let me know if this does not resolve the symptoms within a few days, sooner if he starts getting worse.  I also encouraged him to use a probiotic to help avoid diarrhea as this is his second course of antibiotics in a short period of time  We will check in his thyroid and A1c, HIV and hep C screening as above This visit occurred during the SARS-CoV-2 public health emergency.  Safety protocols were in place, including screening questions prior to the visit, additional usage of staff PPE, and extensive cleaning of exam room while observing appropriate contact time as indicated for disinfecting solutions.   Refill thyroid when TSH level comes in  [place], MD  7/1- received labs as below Message to pt and refilled thyroid med Pre-diabetes stable  Results for orders placed or performed in visit on 05/21/20  Hemoglobin A1c  Result Value Ref Range   Hgb A1c MFr Bld 5.8 4.6 - 6.5 %  TSH  Result Value Ref Range   TSH 0.94 0.35 - 4.50 uIU/mL  Hepatitis C antibody  Result Value Ref Range   Hepatitis C Ab NON-REACTIVE NON-REACTI   SIGNAL TO CUT-OFF 0.01 <1.00  HIV Antibody (routine testing w rflx)  Result Value Ref Range   HIV 1&2 Ab, 4th Generation NON-REACTIVE NON-REACTI

## 2020-05-21 ENCOUNTER — Encounter: Payer: Self-pay | Admitting: Family Medicine

## 2020-05-21 ENCOUNTER — Other Ambulatory Visit: Payer: Self-pay

## 2020-05-21 ENCOUNTER — Ambulatory Visit (INDEPENDENT_AMBULATORY_CARE_PROVIDER_SITE_OTHER): Payer: BC Managed Care – PPO | Admitting: Family Medicine

## 2020-05-21 VITALS — BP 124/70 | HR 74 | Temp 98.8°F | Resp 18 | Ht 71.0 in | Wt 297.0 lb

## 2020-05-21 DIAGNOSIS — E119 Type 2 diabetes mellitus without complications: Secondary | ICD-10-CM | POA: Diagnosis not present

## 2020-05-21 DIAGNOSIS — Z114 Encounter for screening for human immunodeficiency virus [HIV]: Secondary | ICD-10-CM | POA: Diagnosis not present

## 2020-05-21 DIAGNOSIS — Z1159 Encounter for screening for other viral diseases: Secondary | ICD-10-CM | POA: Diagnosis not present

## 2020-05-21 DIAGNOSIS — E890 Postprocedural hypothyroidism: Secondary | ICD-10-CM | POA: Diagnosis not present

## 2020-05-21 DIAGNOSIS — J0110 Acute frontal sinusitis, unspecified: Secondary | ICD-10-CM

## 2020-05-21 MED ORDER — AMOXICILLIN 500 MG PO CAPS: 1000.0000 mg | ORAL_CAPSULE | Freq: Two times a day (BID) | ORAL | 0 refills | Status: DC

## 2020-05-21 NOTE — Patient Instructions (Signed)
It was good to see you again today- please let me know if you are not feeling better in the next few days- Sooner if worse.  I will be in touch with your labs asap

## 2020-05-22 ENCOUNTER — Encounter: Payer: Self-pay | Admitting: Family Medicine

## 2020-05-22 LAB — HIV ANTIBODY (ROUTINE TESTING W REFLEX): HIV 1&2 Ab, 4th Generation: NONREACTIVE

## 2020-05-22 LAB — HEPATITIS C ANTIBODY
Hepatitis C Ab: NONREACTIVE
SIGNAL TO CUT-OFF: 0.01 (ref <1.00)

## 2020-05-22 LAB — HEMOGLOBIN A1C: Hgb A1c MFr Bld: 5.8 % (ref 4.6–6.5)

## 2020-05-22 LAB — TSH: TSH: 0.94 u[IU]/mL (ref 0.35–4.50)

## 2020-05-22 MED ORDER — LEVOTHYROXINE SODIUM 150 MCG PO TABS: 150.0000 ug | ORAL_TABLET | Freq: Every day | ORAL | 3 refills | Status: DC

## 2020-05-22 NOTE — Addendum Note (Signed)
Addended by: Lamar Blinks C on: 05/22/2020 12:35 PM   Modules accepted: Orders

## 2020-06-06 ENCOUNTER — Encounter: Payer: Self-pay | Admitting: Family Medicine

## 2020-06-06 MED ORDER — PREDNISONE 20 MG PO TABS
ORAL_TABLET | ORAL | 0 refills | Status: DC
Start: 2020-06-06 — End: 2020-08-22

## 2020-06-06 NOTE — Addendum Note (Signed)
Addended by: Lamar Blinks C on: 06/06/2020 03:15 PM   Modules accepted: Orders

## 2020-07-06 ENCOUNTER — Other Ambulatory Visit: Payer: Self-pay | Admitting: Family Medicine

## 2020-07-21 DIAGNOSIS — Z9989 Dependence on other enabling machines and devices: Secondary | ICD-10-CM | POA: Diagnosis not present

## 2020-07-21 DIAGNOSIS — G4733 Obstructive sleep apnea (adult) (pediatric): Secondary | ICD-10-CM | POA: Diagnosis not present

## 2020-07-21 DIAGNOSIS — Z955 Presence of coronary angioplasty implant and graft: Secondary | ICD-10-CM | POA: Diagnosis not present

## 2020-07-21 DIAGNOSIS — J310 Chronic rhinitis: Secondary | ICD-10-CM | POA: Diagnosis not present

## 2020-07-27 ENCOUNTER — Other Ambulatory Visit: Payer: Self-pay | Admitting: Cardiology

## 2020-07-31 DIAGNOSIS — J3089 Other allergic rhinitis: Secondary | ICD-10-CM | POA: Diagnosis not present

## 2020-07-31 DIAGNOSIS — H1045 Other chronic allergic conjunctivitis: Secondary | ICD-10-CM | POA: Diagnosis not present

## 2020-08-18 DIAGNOSIS — M9901 Segmental and somatic dysfunction of cervical region: Secondary | ICD-10-CM | POA: Diagnosis not present

## 2020-08-18 DIAGNOSIS — M542 Cervicalgia: Secondary | ICD-10-CM | POA: Diagnosis not present

## 2020-08-18 DIAGNOSIS — M9903 Segmental and somatic dysfunction of lumbar region: Secondary | ICD-10-CM | POA: Diagnosis not present

## 2020-08-18 DIAGNOSIS — M9902 Segmental and somatic dysfunction of thoracic region: Secondary | ICD-10-CM | POA: Diagnosis not present

## 2020-08-22 ENCOUNTER — Ambulatory Visit (INDEPENDENT_AMBULATORY_CARE_PROVIDER_SITE_OTHER): Payer: BC Managed Care – PPO | Admitting: Cardiology

## 2020-08-22 ENCOUNTER — Other Ambulatory Visit: Payer: Self-pay

## 2020-08-22 ENCOUNTER — Encounter: Payer: Self-pay | Admitting: Cardiology

## 2020-08-22 VITALS — BP 120/68 | HR 69 | Ht 71.0 in | Wt 299.0 lb

## 2020-08-22 DIAGNOSIS — I10 Essential (primary) hypertension: Secondary | ICD-10-CM | POA: Diagnosis not present

## 2020-08-22 DIAGNOSIS — E7800 Pure hypercholesterolemia, unspecified: Secondary | ICD-10-CM | POA: Diagnosis not present

## 2020-08-22 DIAGNOSIS — Z79899 Other long term (current) drug therapy: Secondary | ICD-10-CM

## 2020-08-22 DIAGNOSIS — I2510 Atherosclerotic heart disease of native coronary artery without angina pectoris: Secondary | ICD-10-CM | POA: Diagnosis not present

## 2020-08-22 DIAGNOSIS — Z9989 Dependence on other enabling machines and devices: Secondary | ICD-10-CM

## 2020-08-22 DIAGNOSIS — G4733 Obstructive sleep apnea (adult) (pediatric): Secondary | ICD-10-CM

## 2020-08-22 MED ORDER — NITROGLYCERIN 0.4 MG SL SUBL: 0.4000 mg | SUBLINGUAL_TABLET | SUBLINGUAL | 6 refills | Status: DC | PRN

## 2020-08-22 NOTE — Patient Instructions (Addendum)
Medication Instructions:  Your physician has recommended you make the following change in your medication:   Take Nitroglycerin as needed. Stop Plavix  *If you need a refill on your cardiac medications before your next appointment, please call your pharmacy*   Lab Work: Your physician recommends that you have labs done in the office today. Your test included  basic metabolic panel, complete blood count, TSH, liver function and lipids.  If you have labs (blood work) drawn today and your tests are completely normal, you will receive your results only by: Marland Kitchen MyChart Message (if you have MyChart) OR . A paper copy in the mail If you have any lab test that is abnormal or we need to change your treatment, we will call you to review the results.   Testing/Procedures: None ordered   Follow-Up: At [facility], you and your health needs are our priority.  As part of our continuing mission to provide you with exceptional heart care, we have created designated Provider Care Teams.  These Care Teams include your primary Cardiologist (physician) and Advanced Practice Providers (APPs -  Physician Assistants and Nurse Practitioners) who all work together to provide you with the care you need, when you need it.  We recommend signing up for the patient portal called "MyChart".  Sign up information is provided on this After Visit Summary.  MyChart is used to connect with patients for Virtual Visits (Telemedicine).  Patients are able to view lab/test results, encounter notes, upcoming appointments, etc.  Non-urgent messages can be sent to your provider as well.   To learn more about what you can do with MyChart, go to [email or web address].    Your next appointment:   6 month(s)  The format for your next appointment:   In Person  Provider:   Jyl Heinz, MD   Other Instructions NA

## 2020-08-22 NOTE — Progress Notes (Signed)
Cardiology Office Note:    Date:  08/22/2020   ID:  Scott Bauer, DOB May 08, 1956, MRN [id number]  PCP:  Darreld Mclean, MD  Cardiologist:  Jenean Lindau, MD   Referring MD: Darreld Mclean, MD    ASSESSMENT:    1. Coronary artery disease involving native coronary artery of native heart without angina pectoris   2. Hypercholesterolemia   3. Essential hypertension   4. Morbid obesity ([place])   5. OSA on CPAP    PLAN:    In order of problems listed above:  1. Coronary artery disease: Secondary prevention stressed with the patient.  Importance of compliance with diet medication stressed any vocalized understanding. 2. Essential hypertension: Blood pressure stable and diet was emphasized 3. Mixed dyslipidemia: Diet was emphasized and he will have blood work today as he is fasting. 4. Morbid obesity: I explained the risks of this.  He promises to do better.  I told him to walk at least half an hour a day on a regular basis and he promises to do so.  Weight reduction was stressed and risks of obesity explained.  He will have fasting blood work today including lipids 5. Sleep apnea: Sleep health issues were discussed and he is compliant with these. 6. Dual antiplatelet therapy: I told him the current guidelines and that he could stop clopidogrel and continue aspirin he would like to stop.  Sublingual nitroglycerin prescription was sent, its protocol and 911 protocol explained and the patient vocalized understanding questions were answered to the patient's satisfaction 7. Patient will be seen in follow-up appointment in 6 months or earlier if the patient has any concerns    Medication Adjustments/Labs and Tests Ordered: Current medicines are reviewed at length with the patient today.  Concerns regarding medicines are outlined above.  No orders of the defined types were placed in this encounter.  No orders of the defined types were placed in this encounter.    Chief  Complaint  Patient presents with  . Follow-up     History of Present Illness:    Scott Bauer is a 64 y.o. male.  Patient has past medical history of coronary artery disease and coronary stenting twice in the past.  He has history of essential hypertension dyslipidemia diabetes mellitus and morbid obesity.  He leads a sedentary lifestyle.  He denies any chest pain orthopnea or PND.  At the time of my evaluation, the patient is alert awake oriented and in no distress.  Past Medical History:  Diagnosis Date  . Allergy   . Anal fissure   . Colon polyp   . Diabetes mellitus without complication ([place])    type 2  on no meds  . Fatty liver   . Heart disease   . Hyperlipidemia   . Hypertension   . Hypothyroidism   . Myocardial infarction [facility]) 2008   placed 2 stents ; had additional stent placed in 2010  . Pneumonia 2000  . Sleep apnea    on CPAP machine    Past Surgical History:  Procedure Laterality Date  . ANAL FISSURE REPAIR    . CARDIAC CATHETERIZATION    . COLOSTOMY  2015  . LAPAROSCOPIC GASTRIC SLEEVE RESECTION N/A 05/30/2017   Procedure: LAPAROSCOPIC GASTRIC SLEEVE RESECTION, UPPER ENDO;  Surgeon: Greer Pickerel, MD;  Location: [facility];  Service: General;  Laterality: N/A;  . NASAL SINUS SURGERY      Current Medications: Current Meds  Medication Sig  . aspirin EC  81 MG tablet Take 81 mg by mouth daily.  Marland Kitchen atorvastatin (LIPITOR) 80 MG tablet TAKE 1 TABLET(80 MG) BY MOUTH DAILY AT 6 PM  . carvedilol (COREG) 6.25 MG tablet Take 6.25 mg by mouth 2 (two) times daily with a meal.  . cetirizine (ZYRTEC) 10 MG tablet TK 1 T PO QD  . Cholecalciferol (VITAMIN D-3) 5000 units TABS Take 5,000 Units by mouth every evening.  . clopidogrel (PLAVIX) 75 MG tablet TAKE 1 TABLET(75 MG) BY MOUTH DAILY  . Coenzyme Q10 (COQ10) 100 MG CAPS Take 100 mg by mouth daily.  Marland Kitchen levothyroxine (SYNTHROID) 150 MCG tablet TAKE 1 TABLET(150 MCG) BY MOUTH DAILY BEFORE BREAKFAST  . nitroGLYCERIN (NITROSTAT)  0.4 MG SL tablet as needed.  . ramipril (ALTACE) 2.5 MG capsule Take 2.5 mg by mouth daily.  Marland Kitchen UNABLE TO FIND Med Name: Lpratropium 0.06% 78mL as needed  . UNABLE TO FIND 500 mg daily. Med Name: Mega Red 500 mg  . UNABLE TO FIND Take 500 mg by mouth 3 (three) times daily. Med Name: Calcium Citrate  . UNABLE TO FIND Take by mouth daily. Med Name: Bariatric Advantage  . vitamin E 400 UNIT capsule Take 400 Units by mouth daily.     Allergies:   Cortisone and Prednisone   Social History   Socioeconomic History  . Marital status: Married    Spouse name: Not on file  . Number of children: Not on file  . Years of education: Not on file  . Highest education level: Not on file  Occupational History  . Not on file  Tobacco Use  . Smoking status: Current Some Day Smoker    Types: Cigarettes, Cigars    Last attempt to quit: 2008    Years since quitting: 13.7  . Smokeless tobacco: Never Used  . Tobacco comment: occasionally   Vaping Use  . Vaping Use: Never used  Substance and Sexual Activity  . Alcohol use: Yes    Alcohol/week: 1.0 standard drink    Types: 1 Cans of beer per week    Comment: beer occasionally , sometimes a mixed drink   . Drug use: No    Comment: occasionally "marijuana gummie" out of town  . Sexual activity: Not on file  Other Topics Concern  . Not on file  Social History Narrative  . Not on file   Social Determinants of Health   Financial Resource Strain:   . Difficulty of Paying Living Expenses: Not on file  Food Insecurity:   . Worried About Charity fundraiser in the Last Year: Not on file  . Ran Out of Food in the Last Year: Not on file  Transportation Needs:   . Lack of Transportation (Medical): Not on file  . Lack of Transportation (Non-Medical): Not on file  Physical Activity:   . Days of Exercise per Week: Not on file  . Minutes of Exercise per Session: Not on file  Stress:   . Feeling of Stress : Not on file  Social Connections:   . Frequency  of Communication with Friends and Family: Not on file  . Frequency of Social Gatherings with Friends and Family: Not on file  . Attends Religious Services: Not on file  . Active Member of Clubs or Organizations: Not on file  . Attends Archivist Meetings: Not on file  . Marital Status: Not on file     Family History: The patient's family history includes Alcohol abuse in his father; Arthritis  in his brother, father, and maternal grandfather; Cancer in his father and another family member; Depression in his mother and sister; Diabetes in his mother and another family member; Drug abuse in his sister; Early death in his sister; Heart attack in his brother, father, and maternal grandmother; Heart disease in his sister; Hyperlipidemia in his sister; Hypertension in his sister and another family member; Kidney disease in his mother; Miscarriages / [place] in his mother. There is no history of Colon cancer, Colon polyps, Esophageal cancer, Stomach cancer, or Rectal cancer.  ROS:   Please see the history of present illness.    All other systems reviewed and are negative.  EKGs/Labs/Other Studies Reviewed:    The following studies were reviewed today: EKG reveals sinus rhythm and nonspecific ST-T changes   Recent Labs: 01/03/2020: ALT 49; BUN 14; Creatinine, Ser 0.83; Hemoglobin 14.6; Platelets 112; Potassium 4.4; Sodium 140 05/21/2020: TSH 0.94  Recent Lipid Panel    Component Value Date/Time   CHOL 128 01/03/2020 0851   TRIG 83 01/03/2020 0851   HDL 51 01/03/2020 0851   CHOLHDL 2.5 01/03/2020 0851   LDLCALC 61 01/03/2020 0851    Physical Exam:    VS:  BP 120/68 (BP Location: Right Arm, Patient Position: Sitting, Cuff Size: Large)   Pulse 69   Ht 5\' 11"  (1.803 m)   Wt 299 lb (135.6 kg)   SpO2 96%   BMI 41.70 kg/m     Wt Readings from Last 3 Encounters:  08/22/20 299 lb (135.6 kg)  05/21/20 297 lb (134.7 kg)  04/09/20 292 lb (132.5 kg)     GEN: Patient is in no  acute distress HEENT: Normal NECK: No JVD; No carotid bruits LYMPHATICS: No lymphadenopathy CARDIAC: Hear sounds regular, 2/6 systolic murmur at the apex. RESPIRATORY:  Clear to auscultation without rales, wheezing or rhonchi  ABDOMEN: Soft, non-tender, non-distended MUSCULOSKELETAL:  No edema; No deformity  SKIN: Warm and dry NEUROLOGIC:  Alert and oriented x 3 PSYCHIATRIC:  Normal affect   Signed, Jenean Lindau, MD  08/22/2020 8:15 AM    [place]

## 2020-08-23 LAB — BASIC METABOLIC PANEL
BUN/Creatinine Ratio: 15 (ref 10–24)
BUN: 14 mg/dL (ref 8–27)
CO2: 26 mmol/L (ref 20–29)
Calcium: 9.7 mg/dL (ref 8.6–10.2)
Chloride: 101 mmol/L (ref 96–106)
Creatinine, Ser: 0.93 mg/dL (ref 0.76–1.27)
GFR calc Af Amer: 101 mL/min/{1.73_m2} (ref >59)
GFR calc non Af Amer: 87 mL/min/{1.73_m2} (ref >59)
Glucose: 122 mg/dL — ABNORMAL HIGH (ref 65–99)
Potassium: 4.2 mmol/L (ref 3.5–5.2)
Sodium: 142 mmol/L (ref 134–144)

## 2020-08-23 LAB — TSH: TSH: 2.61 u[IU]/mL (ref 0.450–4.500)

## 2020-08-23 LAB — HEPATIC FUNCTION PANEL
ALT: 46 IU/L — ABNORMAL HIGH (ref 0–44)
AST: 26 IU/L (ref 0–40)
Albumin: 4.6 g/dL (ref 3.8–4.8)
Alkaline Phosphatase: 134 IU/L — ABNORMAL HIGH (ref 44–121)
Bilirubin Total: 0.5 mg/dL (ref 0.0–1.2)
Bilirubin, Direct: 0.19 mg/dL (ref 0.00–0.40)
Total Protein: 7 g/dL (ref 6.0–8.5)

## 2020-08-23 LAB — LIPID PANEL
Chol/HDL Ratio: 2.8 ratio (ref 0.0–5.0)
Cholesterol, Total: 141 mg/dL (ref 100–199)
HDL: 50 mg/dL (ref >39)
LDL Chol Calc (NIH): 72 mg/dL (ref 0–99)
Triglycerides: 103 mg/dL (ref 0–149)
VLDL Cholesterol Cal: 19 mg/dL (ref 5–40)

## 2020-08-23 LAB — CBC WITH DIFFERENTIAL/PLATELET
Basophils Absolute: 0 10*3/uL (ref 0.0–0.2)
Basos: 1 %
EOS (ABSOLUTE): 0.2 10*3/uL (ref 0.0–0.4)
Eos: 3 %
Hematocrit: 43.9 % (ref 37.5–51.0)
Hemoglobin: 15.2 g/dL (ref 13.0–17.7)
Immature Grans (Abs): 0 10*3/uL (ref 0.0–0.1)
Immature Granulocytes: 0 %
Lymphocytes Absolute: 1.9 10*3/uL (ref 0.7–3.1)
Lymphs: 34 %
MCH: 30.5 pg (ref 26.6–33.0)
MCHC: 34.6 g/dL (ref 31.5–35.7)
MCV: 88 fL (ref 79–97)
Monocytes Absolute: 0.6 10*3/uL (ref 0.1–0.9)
Monocytes: 10 %
Neutrophils Absolute: 3 10*3/uL (ref 1.4–7.0)
Neutrophils: 52 %
Platelets: 106 10*3/uL — ABNORMAL LOW (ref 150–450)
RBC: 4.99 x10E6/uL (ref 4.14–5.80)
RDW: 13.1 % (ref 11.6–15.4)
WBC: 5.7 10*3/uL (ref 3.4–10.8)

## 2020-08-25 MED ORDER — ATORVASTATIN CALCIUM 40 MG PO TABS: 40.0000 mg | ORAL_TABLET | Freq: Every day | ORAL | 3 refills | Status: DC

## 2020-08-25 NOTE — Addendum Note (Signed)
Addended by: Truddie Hidden on: 08/25/2020 01:53 PM   Modules accepted: Orders

## 2020-09-19 DIAGNOSIS — G4733 Obstructive sleep apnea (adult) (pediatric): Secondary | ICD-10-CM | POA: Diagnosis not present

## 2020-09-25 DIAGNOSIS — J069 Acute upper respiratory infection, unspecified: Secondary | ICD-10-CM | POA: Diagnosis not present

## 2020-09-25 DIAGNOSIS — Z20822 Contact with and (suspected) exposure to covid-19: Secondary | ICD-10-CM | POA: Diagnosis not present

## 2020-09-29 ENCOUNTER — Encounter: Payer: Self-pay | Admitting: Family Medicine

## 2020-09-29 MED ORDER — DOXYCYCLINE HYCLATE 100 MG PO CAPS: 100.0000 mg | ORAL_CAPSULE | Freq: Two times a day (BID) | ORAL | 0 refills | Status: DC

## 2020-09-30 MED ORDER — HYDROCODONE-HOMATROPINE 5-1.5 MG/5ML PO SYRP
5.0000 mL | ORAL_SOLUTION | Freq: Four times a day (QID) | ORAL | 0 refills | Status: DC | PRN
Start: 2020-09-30 — End: 2020-10-15

## 2020-09-30 NOTE — Addendum Note (Signed)
Addended by: Lamar Blinks C on: 09/30/2020 01:53 PM   Modules accepted: Orders

## 2020-10-03 DIAGNOSIS — Z79899 Other long term (current) drug therapy: Secondary | ICD-10-CM | POA: Diagnosis not present

## 2020-10-04 LAB — HEPATIC FUNCTION PANEL
ALT: 84 IU/L — ABNORMAL HIGH (ref 0–44)
AST: 40 IU/L (ref 0–40)
Albumin: 3.8 g/dL (ref 3.8–4.8)
Alkaline Phosphatase: 175 IU/L — ABNORMAL HIGH (ref 44–121)
Bilirubin Total: 0.5 mg/dL (ref 0.0–1.2)
Bilirubin, Direct: 0.21 mg/dL (ref 0.00–0.40)
Total Protein: 6.4 g/dL (ref 6.0–8.5)

## 2020-10-04 LAB — LIPID PANEL
Chol/HDL Ratio: 2.9 ratio (ref 0.0–5.0)
Cholesterol, Total: 110 mg/dL (ref 100–199)
HDL: 38 mg/dL — ABNORMAL LOW (ref >39)
LDL Chol Calc (NIH): 57 mg/dL (ref 0–99)
Triglycerides: 74 mg/dL (ref 0–149)
VLDL Cholesterol Cal: 15 mg/dL (ref 5–40)

## 2020-10-14 ENCOUNTER — Encounter: Payer: Self-pay | Admitting: Family Medicine

## 2020-10-15 MED ORDER — PREDNISONE 20 MG PO TABS: ORAL_TABLET | ORAL | 0 refills | Status: DC

## 2020-10-15 MED ORDER — HYDROCODONE-HOMATROPINE 5-1.5 MG/5ML PO SYRP
5.0000 mL | ORAL_SOLUTION | Freq: Four times a day (QID) | ORAL | 0 refills | Status: DC | PRN
Start: 2020-10-15 — End: 2021-10-08

## 2020-10-15 NOTE — Addendum Note (Signed)
Addended by: Lamar Blinks C on: 10/15/2020 05:59 AM   Modules accepted: Orders

## 2020-10-27 MED ORDER — ATORVASTATIN CALCIUM 40 MG PO TABS: 40.0000 mg | ORAL_TABLET | Freq: Every day | ORAL | 3 refills | Status: DC

## 2020-10-29 ENCOUNTER — Telehealth: Payer: Self-pay | Admitting: Cardiology

## 2020-10-29 MED ORDER — RAMIPRIL 2.5 MG PO CAPS: 2.5000 mg | ORAL_CAPSULE | Freq: Every day | ORAL | 3 refills | Status: DC

## 2020-10-29 NOTE — Telephone Encounter (Signed)
Pt is aware that refills were sent to pharmacy.

## 2020-10-29 NOTE — Telephone Encounter (Signed)
Follow up:     Patient calling to check the status of his refill. Patient states that they are not at the pharmacy. please call patient back if any questions.

## 2020-11-05 ENCOUNTER — Other Ambulatory Visit: Payer: Self-pay

## 2020-11-05 ENCOUNTER — Telehealth: Payer: Self-pay

## 2020-11-05 MED ORDER — ATORVASTATIN CALCIUM 40 MG PO TABS: 40.0000 mg | ORAL_TABLET | Freq: Every day | ORAL | 3 refills | Status: DC

## 2020-11-05 NOTE — Telephone Encounter (Signed)
Lipitor was changed on 08/29/20 after lab results. Lipitor has been sent in again to [facility].

## 2020-11-08 NOTE — Progress Notes (Deleted)
[place] [facility] [address] [place], [place] [address] [phone]  Date:  11/12/2020   Name:  Scott Bauer   DOB:  June 27, 1956   MRN:  [id number]  PCP:  Darreld Mclean, MD    Chief Complaint: No chief complaint on file.   History of Present Illness:  Scott Bauer is a 64 y.o. very pleasant male patient who presents with the following:  Here today for a follow-up visit  Last seen by myself in June of this year   History of MI, HTN, OSA, hyperlipidemia, DM, hypothymism S/p PCI in 2008 and 2010 Gastric sleeve about 3 years ago- he lost quite a bit of weight  Seen by cardiology in October of this year-  1. Coronary artery disease: Secondary prevention stressed with the patient.  Importance of compliance with diet medication stressed any vocalized understanding. 2. Essential hypertension: Blood pressure stable and diet was emphasized 3. Mixed dyslipidemia: Diet was emphasized and he will have blood work today as he is fasting. 4. Morbid obesity: I explained the risks of this.  He promises to do better.  I told him to walk at least half an hour a day on a regular basis and he promises to do so.  Weight reduction was stressed and risks of obesity explained.  He will have fasting blood work today including lipids 5. Sleep apnea: Sleep health issues were discussed and he is compliant with these. 6. Dual antiplatelet therapy: I told him the current guidelines and that he could stop clopidogrel and continue aspirin he would like to stop.  Sublingual nitroglycerin prescription was sent, its protocol and 911 protocol explained and the patient vocalized understanding questions were answered to the patient's satisfaction   Eye exam: Foot exam due Flu vaccine covid booster  shingrix done  Thyroid UTD  Lab Results  Component Value Date   HGBA1C 5.8 05/21/2020    Patient Active Problem List   Diagnosis Date Noted  .  Postablative hypothyroidism 01/03/2019  . Diet-controlled diabetes mellitus ([place]) 01/03/2019  . Idiopathic thrombocytopenia ([place]) 01/03/2019  . History of MI (myocardial infarction) 01/03/2019  . Erectile dysfunction 04/09/2018  . Rhinitis, chronic 07/11/2017  . History of right coronary artery stent placement 07/01/2017  . H/O bariatric surgery 06/08/2017  . Obesity, Class III, BMI 40-49.9 (morbid obesity) ([place]) 05/30/2017  . Diabetes mellitus type 2 in obese ([place]) 05/30/2017  . Essential hypertension 05/30/2017  . OSA on CPAP 05/30/2017  . Hypercholesterolemia 05/30/2017  . Morbid obesity ([place]) 05/30/2017  . Acquired hypothyroidism 12/16/2015  . Family history of abdominal aortic aneurysm (AAA) 12/16/2015  . Testicular hypofunction 12/16/2015  . Thrombocytopenia, unspecified ([place]) 12/16/2015  . Coronary artery disease involving native coronary artery of native heart without angina pectoris 05/22/2015  . Old MI (myocardial infarction) 05/22/2015    Past Medical History:  Diagnosis Date  . Allergy   . Anal fissure   . Colon polyp   . Diabetes mellitus without complication ([place])    type 2  on no meds  . Fatty liver   . Heart disease   . Hyperlipidemia   . Hypertension   . Hypothyroidism   . Myocardial infarction [facility]) 2008   placed 2 stents ; had additional stent placed in 2010  . Pneumonia 2000  . Sleep apnea    on CPAP machine    Past Surgical History:  Procedure Laterality Date  . ANAL FISSURE REPAIR    .  CARDIAC CATHETERIZATION    . COLOSTOMY  2015  . LAPAROSCOPIC GASTRIC SLEEVE RESECTION N/A 05/30/2017   Procedure: LAPAROSCOPIC GASTRIC SLEEVE RESECTION, UPPER ENDO;  Surgeon: Greer Pickerel, MD;  Location: [facility];  Service: General;  Laterality: N/A;  . NASAL SINUS SURGERY      Social History   Tobacco Use  . Smoking status: Current Some Day Smoker    Types: Cigarettes, Cigars    Last attempt to quit: 2008    Years since quitting: 13.9  . Smokeless tobacco:  Never Used  . Tobacco comment: occasionally   Vaping Use  . Vaping Use: Never used  Substance Use Topics  . Alcohol use: Yes    Alcohol/week: 1.0 standard drink    Types: 1 Cans of beer per week    Comment: beer occasionally , sometimes a mixed drink   . Drug use: No    Comment: occasionally "marijuana gummie" out of town    Family History  Problem Relation Age of Onset  . Cancer Other   . Hypertension Other   . Diabetes Other   . Depression Mother   . Diabetes Mother   . Kidney disease Mother   . Miscarriages / [place] Mother   . Alcohol abuse Father   . Arthritis Father   . Cancer Father   . Heart attack Father   . Heart disease Sister   . Hyperlipidemia Sister   . Hypertension Sister   . Depression Sister   . Drug abuse Sister   . Early death Sister   . Arthritis Brother   . Heart attack Brother   . Heart attack Maternal Grandmother   . Arthritis Maternal Grandfather   . Colon cancer Neg Hx   . Colon polyps Neg Hx   . Esophageal cancer Neg Hx   . Stomach cancer Neg Hx   . Rectal cancer Neg Hx     Allergies  Allergen Reactions  . Cortisone   . Prednisone Nausea Only and Other (See Comments)    Makes him feel funny  Only has issues when takes dose packs    Medication list has been reviewed and updated.  Current Outpatient Medications on File Prior to Visit  Medication Sig Dispense Refill  . aspirin EC 81 MG tablet Take 81 mg by mouth daily.    Marland Kitchen atorvastatin (LIPITOR) 40 MG tablet Take 1 tablet (40 mg total) by mouth daily. 90 tablet 3  . carvedilol (COREG) 6.25 MG tablet Take 6.25 mg by mouth 2 (two) times daily with a meal.    . cetirizine (ZYRTEC) 10 MG tablet TK 1 T PO QD    . Cholecalciferol (VITAMIN D-3) 5000 units TABS Take 5,000 Units by mouth every evening.    . Coenzyme Q10 (COQ10) 100 MG CAPS Take 100 mg by mouth daily.    Marland Kitchen doxycycline (VIBRAMYCIN) 100 MG capsule Take 1 capsule (100 mg total) by mouth 2 (two) times daily. 20 capsule 0  .  HYDROcodone-homatropine (HYCODAN) 5-1.5 MG/5ML syrup Take 5 mLs by mouth every 6 (six) hours as needed. 40 mL 0  . levothyroxine (SYNTHROID) 150 MCG tablet TAKE 1 TABLET(150 MCG) BY MOUTH DAILY BEFORE BREAKFAST 30 tablet 2  . nitroGLYCERIN (NITROSTAT) 0.4 MG SL tablet Place 1 tablet (0.4 mg total) under the tongue every 5 (five) minutes as needed. 25 tablet 6  . predniSONE (DELTASONE) 20 MG tablet Take 20 mg daily for 5 days 5 tablet 0  . ramipril (ALTACE) 2.5 MG capsule Take 1  capsule (2.5 mg total) by mouth daily. 90 capsule 3  . UNABLE TO FIND Med Name: Lpratropium 0.06% 32mL as needed    . UNABLE TO FIND 500 mg daily. Med Name: Mega Red 500 mg    . UNABLE TO FIND Take 500 mg by mouth 3 (three) times daily. Med Name: Calcium Citrate    . UNABLE TO FIND Take by mouth daily. Med Name: Bariatric Advantage    . vitamin E 400 UNIT capsule Take 400 Units by mouth daily.     No current facility-administered medications on file prior to visit.    Review of Systems:  As per HPI- otherwise negative.   Physical Examination: There were no vitals filed for this visit. There were no vitals filed for this visit. There is no height or weight on file to calculate BMI. Ideal Body Weight:    GEN: no acute distress. HEENT: Atraumatic, Normocephalic.  Ears and Nose: No external deformity. CV: RRR, No M/G/R. No JVD. No thrill. No extra heart sounds. PULM: CTA B, no wheezes, crackles, rhonchi. No retractions. No resp. distress. No accessory muscle use. ABD: S, NT, ND, +BS. No rebound. No HSM. EXTR: No c/c/e PSYCH: Normally interactive. Conversant.  Foot exam   Assessment and Plan: *** This visit occurred during the SARS-CoV-2 public health emergency.  Safety protocols were in place, including screening questions prior to the visit, additional usage of staff PPE, and extensive cleaning of exam room while observing appropriate contact time as indicated for disinfecting solutions.    Signed Lamar Blinks, MD

## 2020-11-10 ENCOUNTER — Encounter: Payer: Self-pay | Admitting: Family Medicine

## 2020-11-10 DIAGNOSIS — U071 COVID-19: Secondary | ICD-10-CM | POA: Diagnosis not present

## 2020-11-10 MED ORDER — AMOXICILLIN 500 MG PO CAPS: 1000.0000 mg | ORAL_CAPSULE | Freq: Two times a day (BID) | ORAL | 0 refills | Status: DC

## 2020-11-10 NOTE — Telephone Encounter (Signed)
Called the covid antibody hotline and left pt information

## 2020-11-10 NOTE — Addendum Note (Signed)
Addended by: Lamar Blinks C on: 11/10/2020 04:12 PM   Modules accepted: Orders

## 2020-11-11 ENCOUNTER — Encounter: Payer: Self-pay | Admitting: Nurse Practitioner

## 2020-11-11 ENCOUNTER — Other Ambulatory Visit (HOSPITAL_COMMUNITY): Payer: Self-pay | Admitting: Nurse Practitioner

## 2020-11-11 DIAGNOSIS — U071 COVID-19: Secondary | ICD-10-CM

## 2020-11-11 NOTE — Progress Notes (Signed)
I connected by phone with Scott Bauer on 11/11/2020 at 3:17 PM to discuss the potential use of a treatment for mild to moderate COVID-19 viral infection in non-hospitalized patients.  This patient is a 64 y.o. male that meets the FDA criteria for Emergency Use Authorization of bamlanivimab/etesevimab, casirivimab\imdevimab, or sotrovimab  Has a (+) direct SARS-CoV-2 viral test result  Has mild or moderate COVID-19   Is ? 63 years of age and weighs ? 40 kg  Is NOT hospitalized due to COVID-19  Is NOT requiring oxygen therapy or requiring an increase in baseline oxygen flow rate due to COVID-19  Is within 10 days of symptom onset  Has at least one of the high risk factor(s) for progression to severe COVID-19 and/or hospitalization as defined in EUA.  Specific high risk criteria : BMI > 25 and Cardiovascular disease or hypertension   I have spoken and communicated the following to the patient or parent/caregiver:  1. FDA has authorized the emergency use of bamlanivimab/etesevimab, casirivimab\imdevimab, or sotrovimab for the treatment of mild to moderate COVID-19 in adults and pediatric patients with positive results of direct SARS-CoV-2 viral testing who are 86 years of age and older weighing at least 40 kg, and who are at high risk for progressing to severe COVID-19 and/or hospitalization.  2. The significant known and potential risks and benefits of bamlanivimab/etesevimab, casirivimab\imdevimab, or sotrovimab, and the extent to which such potential risks and benefits are unknown.  3. Information on available alternative treatments and the risks and benefits of those alternatives, including clinical trials.  4. Patients treated with bamlanivimab/etesevimab, casirivimab\imdevimab, or sotrovimab should continue to self-isolate and use infection control measures (e.g., wear mask, isolate, social distance, avoid sharing personal items, clean and disinfect "high touch" surfaces, and  frequent handwashing) according to CDC guidelines.   5. The patient or parent/caregiver has the option to accept or refuse bamlanivimab/etesevimab, casirivimab\imdevimab, or sotrovimab.  After reviewing this information with the patient, the patient has agreed to receive one of the available covid 19 monoclonal antibodies and will be provided an appropriate fact sheet prior to infusion.[name], [place], AGNP-C [phone] ([place])

## 2020-11-12 ENCOUNTER — Ambulatory Visit: Payer: BC Managed Care – PPO | Admitting: Family Medicine

## 2020-11-12 ENCOUNTER — Ambulatory Visit (HOSPITAL_COMMUNITY)
Admission: RE | Admit: 2020-11-12 | Discharge: 2020-11-12 | Disposition: A | Discharge status: Home / Self Care | Payer: BC Managed Care – PPO | Source: Ambulatory Visit | Attending: Pulmonary Disease | Admitting: Pulmonary Disease

## 2020-11-12 DIAGNOSIS — U071 COVID-19: Secondary | ICD-10-CM

## 2020-11-12 MED ORDER — METHYLPREDNISOLONE SODIUM SUCC 125 MG IJ SOLR: 125.0000 mg | Freq: Once | INTRAMUSCULAR | Status: DC | PRN

## 2020-11-12 MED ORDER — SODIUM CHLORIDE 0.9 % IV SOLN: Freq: Once | INTRAVENOUS | Status: AC

## 2020-11-12 MED ORDER — FAMOTIDINE IN NACL 20-0.9 MG/50ML-% IV SOLN: 20.0000 mg | Freq: Once | INTRAVENOUS | Status: DC | PRN

## 2020-11-12 MED ORDER — SODIUM CHLORIDE 0.9 % IV SOLN: INTRAVENOUS | Status: DC | PRN

## 2020-11-12 MED ORDER — ALBUTEROL SULFATE HFA 108 (90 BASE) MCG/ACT IN AERS: 2.0000 | INHALATION_SPRAY | Freq: Once | RESPIRATORY_TRACT | Status: DC | PRN

## 2020-11-12 MED ORDER — EPINEPHRINE 0.3 MG/0.3ML IJ SOAJ: 0.3000 mg | Freq: Once | INTRAMUSCULAR | Status: DC | PRN

## 2020-11-12 MED ORDER — DIPHENHYDRAMINE HCL 50 MG/ML IJ SOLN: 50.0000 mg | Freq: Once | INTRAMUSCULAR | Status: DC | PRN

## 2020-11-12 NOTE — Discharge Instructions (Signed)
10 Things You Can Do to Manage Your COVID-19 Symptoms at Home If you have possible or confirmed COVID-19: 1. Stay home from work and school. And stay away from other public places. If you must go out, avoid using any kind of public transportation, ridesharing, or taxis. 2. Monitor your symptoms carefully. If your symptoms get worse, call your healthcare provider immediately. 3. Get rest and stay hydrated. 4. If you have a medical appointment, call the healthcare provider ahead of time and tell them that you have or may have COVID-19. 5. For medical emergencies, call 911 and notify the dispatch personnel that you have or may have COVID-19. 6. Cover your cough and sneezes with a tissue or use the inside of your elbow. 7. Wash your hands often with soap and water for at least 20 seconds or clean your hands with an alcohol-based hand sanitizer that contains at least 60% alcohol. 8. As much as possible, stay in a specific room and away from other people in your home. Also, you should use a separate bathroom, if available. If you need to be around other people in or outside of the home, wear a mask. 9. Avoid sharing personal items with other people in your household, like dishes, towels, and bedding. 10. Clean all surfaces that are touched often, like counters, tabletops, and doorknobs. Use household cleaning sprays or wipes according to the label instructions. cdc.gov/coronavirus 05/23/2019 This information is not intended to replace advice given to you by your health care provider. Make sure you discuss any questions you have with your health care provider. Document Revised: 10/25/2019 Document Reviewed: 10/25/2019 Elsevier Patient Education  2020 Elsevier Inc. What types of side effects do monoclonal antibody drugs cause?  Common side effects  In general, the more common side effects caused by monoclonal antibody drugs include: . Allergic reactions, such as hives or itching . Flu-like signs and  symptoms, including chills, fatigue, fever, and muscle aches and pains . Nausea, vomiting . Diarrhea . Skin rashes . Low blood pressure   The CDC is recommending patients who receive monoclonal antibody treatments wait at least 90 days before being vaccinated.  Currently, there are no data on the safety and efficacy of mRNA COVID-19 vaccines in persons who received monoclonal antibodies or convalescent plasma as part of COVID-19 treatment. Based on the estimated half-life of such therapies as well as evidence suggesting that reinfection is uncommon in the 90 days after initial infection, vaccination should be deferred for at least 90 days, as a precautionary measure until additional information becomes available, to avoid interference of the antibody treatment with vaccine-induced immune responses. If you have any questions or concerns after the infusion please call the Advanced Practice Provider on call at [phone]. This number is ONLY intended for your use regarding questions or concerns about the infusion post-treatment side-effects.  Please do not provide this number to others for use. For return to work notes please contact your primary care provider.   If someone you know is interested in receiving treatment please have them call the COVID hotline at [phone].   

## 2020-11-12 NOTE — Progress Notes (Signed)
  Diagnosis: COVID-19 ° °Physician: Dr. Wright  ° °Procedure: [facility] Infusion Clinic Med: bamlanivimab\etesevimab infusion - Provided patient with bamlanimivab\etesevimab fact sheet for patients, parents and caregivers prior to infusion. ° °Complications: No immediate complications noted. ° °Discharge: Discharged home  ° °Copelyn Widmer  B Corena Tilson °11/12/2020 ° ° °

## 2020-11-12 NOTE — Progress Notes (Signed)
Patient reviewed Fact Sheet for Patients, Parents, and Caregivers for Emergency Use Authorization (EUA) of bamlanivimab and etesevimab for the Treatment of Coronavirus. Patient also reviewed and is agreeable to the estimated cost of treatment. Patient is agreeable to proceed.   

## 2020-12-17 ENCOUNTER — Encounter (HOSPITAL_COMMUNITY): Payer: Self-pay | Admitting: *Deleted

## 2021-01-20 DIAGNOSIS — Z20822 Contact with and (suspected) exposure to covid-19: Secondary | ICD-10-CM | POA: Diagnosis not present

## 2021-02-16 DIAGNOSIS — R6883 Chills (without fever): Secondary | ICD-10-CM | POA: Diagnosis not present

## 2021-02-16 DIAGNOSIS — Z20822 Contact with and (suspected) exposure to covid-19: Secondary | ICD-10-CM | POA: Diagnosis not present

## 2021-02-16 DIAGNOSIS — R5383 Other fatigue: Secondary | ICD-10-CM | POA: Diagnosis not present

## 2021-02-16 DIAGNOSIS — M791 Myalgia, unspecified site: Secondary | ICD-10-CM | POA: Diagnosis not present

## 2021-03-28 ENCOUNTER — Encounter: Payer: Self-pay | Admitting: Family Medicine

## 2021-04-04 DIAGNOSIS — U071 COVID-19: Secondary | ICD-10-CM | POA: Diagnosis not present

## 2021-04-04 MED ORDER — NIRMATRELVIR/RITONAVIR (PAXLOVID)TABLET: 3.0000 | ORAL_TABLET | Freq: Two times a day (BID) | ORAL | 0 refills | Status: AC

## 2021-06-03 ENCOUNTER — Encounter: Payer: Self-pay | Admitting: Family Medicine

## 2021-06-09 DIAGNOSIS — M9903 Segmental and somatic dysfunction of lumbar region: Secondary | ICD-10-CM | POA: Diagnosis not present

## 2021-06-09 DIAGNOSIS — M9901 Segmental and somatic dysfunction of cervical region: Secondary | ICD-10-CM | POA: Diagnosis not present

## 2021-06-09 DIAGNOSIS — M9902 Segmental and somatic dysfunction of thoracic region: Secondary | ICD-10-CM | POA: Diagnosis not present

## 2021-06-09 DIAGNOSIS — M542 Cervicalgia: Secondary | ICD-10-CM | POA: Diagnosis not present

## 2021-07-15 DIAGNOSIS — G4733 Obstructive sleep apnea (adult) (pediatric): Secondary | ICD-10-CM | POA: Diagnosis not present

## 2021-07-15 DIAGNOSIS — J310 Chronic rhinitis: Secondary | ICD-10-CM | POA: Diagnosis not present

## 2021-07-15 DIAGNOSIS — I2510 Atherosclerotic heart disease of native coronary artery without angina pectoris: Secondary | ICD-10-CM | POA: Diagnosis not present

## 2021-07-15 DIAGNOSIS — Z9989 Dependence on other enabling machines and devices: Secondary | ICD-10-CM | POA: Diagnosis not present

## 2021-07-31 DIAGNOSIS — J3089 Other allergic rhinitis: Secondary | ICD-10-CM | POA: Diagnosis not present

## 2021-07-31 DIAGNOSIS — H1045 Other chronic allergic conjunctivitis: Secondary | ICD-10-CM | POA: Diagnosis not present

## 2021-08-06 ENCOUNTER — Other Ambulatory Visit: Payer: Self-pay | Admitting: Cardiology

## 2021-08-06 ENCOUNTER — Other Ambulatory Visit: Payer: Self-pay

## 2021-08-06 ENCOUNTER — Other Ambulatory Visit: Payer: Self-pay | Admitting: Family Medicine

## 2021-08-06 MED ORDER — RAMIPRIL 2.5 MG PO CAPS: ORAL_CAPSULE | ORAL | 0 refills | Status: DC

## 2021-08-06 MED ORDER — ATORVASTATIN CALCIUM 40 MG PO TABS: ORAL_TABLET | ORAL | 0 refills | Status: DC

## 2021-08-06 NOTE — Telephone Encounter (Signed)
Refill of Atorvastatin 40 mg and Ramipril 2.5 mg sent to [facility] [address].

## 2021-09-18 DIAGNOSIS — Z9989 Dependence on other enabling machines and devices: Secondary | ICD-10-CM | POA: Diagnosis not present

## 2021-09-18 DIAGNOSIS — G4733 Obstructive sleep apnea (adult) (pediatric): Secondary | ICD-10-CM | POA: Diagnosis not present

## 2021-09-20 NOTE — Patient Instructions (Addendum)
Good to see you again today, I will be in touch with your labs Assuming all is well please see me in about 6 months  Work on gradually increasing your exercise  I would recommend getting the new bivalent covid booster this fall

## 2021-09-20 NOTE — Progress Notes (Addendum)
[facility] [place], [place], [place] [address] [phone]  Date:  09/23/2021   Name:  Scott Bauer   DOB:  04-26-1956   MRN:  [id number]  PCP:  Darreld Mclean, MD    Chief Complaint: Annual Exam (Concerns/ questions: none/Flu shot today: yes/)   History of Present Illness:  Scott Bauer is a 65 y.o. very pleasant male patient who presents with the following:  Pt seen today for CPE Last visit with myself 6/21 History of MI, HTN, OSA, hyperlipidemia, DM, hypothymism S/p PCI in 2008 and 2010 Gastric sleeve about 5 years ago- he lost quite a bit of weight  He had covid about a year ago, treated with ab infusion  He also had covid in May of this year, treated with paxlovid.  he feels like he has recovered fully   Eye exam- done 7 or 8 months ago Pneumonia booster- will prevnar 20 today  Foot exam due Covid bivalent Labs/ A1c - labs on chart from 11/21- he is fasting this am  Flu- give today   Seen by pulmonology regarding his OSA in August - he notes he has had this for 20 years or so, uses CPAP with satisfactory results His wife is not feeling well right now, she will come and see me soon   He is not getting much exercise- he is seeing cardiology later this month and knows they will be unhappy.  I encouraged him to work on gradually getting back into an exercise routine such as walking He denies any chest pain or shortness of breath  Patient Active Problem List   Diagnosis Date Noted   Postablative hypothyroidism 01/03/2019   Diet-controlled diabetes mellitus ([place]) 01/03/2019   Idiopathic thrombocytopenia ([place]) 01/03/2019   History of MI (myocardial infarction) 01/03/2019   Erectile dysfunction 04/09/2018   Rhinitis, chronic 07/11/2017   History of right coronary artery stent placement 07/01/2017   H/O bariatric surgery 06/08/2017   Obesity, Class III, BMI 40-49.9 (morbid obesity) ([place])  05/30/2017   Diabetes mellitus type 2 in obese ([place]) 05/30/2017   Essential hypertension 05/30/2017   OSA on CPAP 05/30/2017   Hypercholesterolemia 05/30/2017   Morbid obesity ([place]) 05/30/2017   Acquired hypothyroidism 12/16/2015   Family history of abdominal aortic aneurysm (AAA) 12/16/2015   Testicular hypofunction 12/16/2015   Thrombocytopenia, unspecified ([place]) 12/16/2015   Coronary artery disease involving native coronary artery of native heart without angina pectoris 05/22/2015   Old MI (myocardial infarction) 05/22/2015    Past Medical History:  Diagnosis Date   Allergy    Anal fissure    Colon polyp    Diabetes mellitus without complication ([place])    type 2  on no meds   Fatty liver    Heart disease    Hyperlipidemia    Hypertension    Hypothyroidism    Myocardial infarction ([place]) 2008   placed 2 stents ; had additional stent placed in 2010   Pneumonia 2000   Sleep apnea    on CPAP machine    Past Surgical History:  Procedure Laterality Date   ANAL FISSURE REPAIR     CARDIAC CATHETERIZATION     COLOSTOMY  2015   LAPAROSCOPIC GASTRIC SLEEVE RESECTION N/A 05/30/2017   Procedure: LAPAROSCOPIC GASTRIC SLEEVE RESECTION, UPPER ENDO;  Surgeon: Greer Pickerel, MD;  Location: [facility];  Service: General;  Laterality: N/A;   NASAL SINUS SURGERY  Social History   Tobacco Use   Smoking status: Some Days    Types: Cigarettes, Cigars    Last attempt to quit: 2008    Years since quitting: 14.8   Smokeless tobacco: Never   Tobacco comments:    occasionally   Vaping Use   Vaping Use: Never used  Substance Use Topics   Alcohol use: Yes    Alcohol/week: 1.0 standard drink    Types: 1 Cans of beer per week    Comment: beer occasionally , sometimes a mixed drink    Drug use: No    Comment: occasionally "marijuana gummie" out of town    Family History  Problem Relation Age of Onset   Cancer Other    Hypertension Other    Diabetes Other    Depression Mother     Diabetes Mother    Kidney disease Mother    Miscarriages / [place] Mother    Alcohol abuse Father    Arthritis Father    Cancer Father    Heart attack Father    Heart disease Sister    Hyperlipidemia Sister    Hypertension Sister    Depression Sister    Drug abuse Sister    Early death Sister    Arthritis Brother    Heart attack Brother    Heart attack Maternal Grandmother    Arthritis Maternal Grandfather    Colon cancer Neg Hx    Colon polyps Neg Hx    Esophageal cancer Neg Hx    Stomach cancer Neg Hx    Rectal cancer Neg Hx     Allergies  Allergen Reactions   Cortisone    Prednisone Nausea Only and Other (See Comments)    Makes him feel funny  Only has issues when takes dose packs    Medication list has been reviewed and updated.  Current Outpatient Medications on File Prior to Visit  Medication Sig Dispense Refill   amoxicillin (AMOXIL) 500 MG capsule Take 2 capsules (1,000 mg total) by mouth 2 (two) times daily. 40 capsule 0   aspirin EC 81 MG tablet Take 81 mg by mouth daily.     atorvastatin (LIPITOR) 40 MG tablet TAKE 1 TABLET(40 MG) BY MOUTH DAILY 30 tablet 0   carvedilol (COREG) 6.25 MG tablet Take 6.25 mg by mouth 2 (two) times daily with a meal.     cetirizine (ZYRTEC) 10 MG tablet TK 1 T PO QD     Cholecalciferol (VITAMIN D-3) 5000 units TABS Take 5,000 Units by mouth every evening.     Coenzyme Q10 (COQ10) 100 MG CAPS Take 100 mg by mouth daily.     HYDROcodone-homatropine (HYCODAN) 5-1.5 MG/5ML syrup Take 5 mLs by mouth every 6 (six) hours as needed. 40 mL 0   levothyroxine (SYNTHROID) 150 MCG tablet TAKE 1 TABLET(150 MCG) BY MOUTH DAILY BEFORE BREAKFAST 90 tablet 0   nitroGLYCERIN (NITROSTAT) 0.4 MG SL tablet Place 1 tablet (0.4 mg total) under the tongue every 5 (five) minutes as needed. 25 tablet 6   predniSONE (DELTASONE) 20 MG tablet Take 20 mg daily for 5 days 5 tablet 0   ramipril (ALTACE) 2.5 MG capsule TAKE 1 CAPSULE(2.5 MG) BY MOUTH DAILY 30  capsule 0   UNABLE TO FIND Med Name: Lpratropium 0.06% 70mL as needed     UNABLE TO FIND 500 mg daily. Med Name: Mega Red 500 mg     UNABLE TO FIND Take 500 mg by mouth 3 (three) times daily. Med  Name: Calcium Citrate     UNABLE TO FIND Take by mouth daily. Med Name: Bariatric Advantage     vitamin E 400 UNIT capsule Take 400 Units by mouth daily.     No current facility-administered medications on file prior to visit.    Review of Systems:  As per HPI- otherwise negative.   Physical Examination: Vitals:   09/23/21 0817  BP: 120/72  Pulse: 69  Resp: 18  Temp: 98.1 F (36.7 C)  SpO2: 98%   Vitals:   09/23/21 0817  Weight: (!) 309 lb 12.8 oz (140.5 kg)  Height: 5\' 11"  (1.803 m)   Body mass index is 43.21 kg/m. Ideal Body Weight: Weight in (lb) to have BMI = 25: 178.9  GEN: no acute distress.  Obese, otherwise looks well  HEENT: Atraumatic, Normocephalic.  Ears and Nose: No external deformity. CV: RRR, No M/G/R. No JVD. No thrill. No extra heart sounds. PULM: CTA B, no wheezes, crackles, rhonchi. No retractions. No resp. distress. No accessory muscle use. ABD: S, NT, ND, +BS. No rebound. No HSM. EXTR: No c/c/e PSYCH: Normally interactive. Conversant.  Foot exam- done today   Assessment and Plan: Physical exam  Diet-controlled diabetes mellitus ([place]) - Plan: Comprehensive metabolic panel, Hemoglobin A1c  Postablative hypothyroidism - Plan: TSH  Screening for prostate cancer - Plan: PSA  Screening, deficiency anemia, iron - Plan: CBC  Screening for hyperlipidemia - Plan: Lipid panel, atorvastatin (LIPITOR) 40 MG tablet  Need for influenza vaccination - Plan: Flu Vaccine QUAD 6+ mos PF IM (Fluarix Quad PF)  History of weight loss surgery - Plan: B12 and Folate Panel, Ferritin  Immunization due - Plan: Pneumococcal conjugate vaccine 20-valent (Prevnar 20)  Old MI (myocardial infarction) - Plan: ramipril (ALTACE) 2.5 MG capsule  Physical exam, encouraged  healthy diet and exercise routine  Flu shot and pneumonia today  Refill thyroid once TSH comes in  Encouraged him to work on weight loss and he will try  Signed Lamar Blinks, MD  Received labs as below, message to patient Results for orders placed or performed in visit on 09/23/21  CBC  Result Value Ref Range   WBC 4.9 4.0 - 10.5 K/uL   RBC 4.73 4.22 - 5.81 Mil/uL   Platelets 102.0 (L) 150.0 - 400.0 K/uL   Hemoglobin 14.4 13.0 - 17.0 g/dL   HCT 42.4 39.0 - 52.0 %   MCV 89.7 78.0 - 100.0 fl   MCHC 33.9 30.0 - 36.0 g/dL   RDW 13.8 11.5 - 15.5 %  Comprehensive metabolic panel  Result Value Ref Range   Sodium 141 135 - 145 mEq/L   Potassium 3.9 3.5 - 5.1 mEq/L   Chloride 102 96 - 112 mEq/L   CO2 32 19 - 32 mEq/L   Glucose, Bld 133 (H) 70 - 99 mg/dL   BUN 14 6 - 23 mg/dL   Creatinine, Ser 0.91 0.40 - 1.50 mg/dL   Total Bilirubin 0.9 0.2 - 1.2 mg/dL   Alkaline Phosphatase 112 39 - 117 U/L   AST 25 0 - 37 U/L   ALT 45 0 - 53 U/L   Total Protein 6.8 6.0 - 8.3 g/dL   Albumin 4.2 3.5 - 5.2 g/dL   GFR 89.09 >60.00 mL/min   Calcium 9.3 8.4 - 10.5 mg/dL  Hemoglobin A1c  Result Value Ref Range   Hgb A1c MFr Bld 6.2 4.6 - 6.5 %  Lipid panel  Result Value Ref Range   Cholesterol 151 0 -  200 mg/dL   Triglycerides 127.0 0.0 - 149.0 mg/dL   HDL 45.60 >39.00 mg/dL   VLDL 25.4 0.0 - 40.0 mg/dL   LDL Cholesterol 80 0 - 99 mg/dL   Total CHOL/HDL Ratio 3    NonHDL 105.42   PSA  Result Value Ref Range   PSA 0.59 0.10 - 4.00 ng/mL  TSH  Result Value Ref Range   TSH 2.67 0.35 - 5.50 uIU/mL  B12 and Folate Panel  Result Value Ref Range   Vitamin B-12 >1550 (H) 211 - 911 pg/mL   Folate >23.4 >5.9 ng/mL  Ferritin  Result Value Ref Range   Ferritin 154.4 22.0 - 322.0 ng/mL

## 2021-09-22 ENCOUNTER — Other Ambulatory Visit: Payer: Self-pay

## 2021-09-23 ENCOUNTER — Ambulatory Visit (INDEPENDENT_AMBULATORY_CARE_PROVIDER_SITE_OTHER): Payer: BC Managed Care – PPO | Admitting: Family Medicine

## 2021-09-23 ENCOUNTER — Encounter: Payer: Self-pay | Admitting: Family Medicine

## 2021-09-23 VITALS — BP 120/72 | HR 69 | Temp 98.1°F | Resp 18 | Ht 71.0 in | Wt 309.8 lb

## 2021-09-23 DIAGNOSIS — E890 Postprocedural hypothyroidism: Secondary | ICD-10-CM

## 2021-09-23 DIAGNOSIS — Z13220 Encounter for screening for lipoid disorders: Secondary | ICD-10-CM | POA: Diagnosis not present

## 2021-09-23 DIAGNOSIS — Z125 Encounter for screening for malignant neoplasm of prostate: Secondary | ICD-10-CM

## 2021-09-23 DIAGNOSIS — Z130 Encounter for screening for diseases of the blood and blood-forming organs and certain disorders involving the immune mechanism: Secondary | ICD-10-CM | POA: Diagnosis not present

## 2021-09-23 DIAGNOSIS — I252 Old myocardial infarction: Secondary | ICD-10-CM

## 2021-09-23 DIAGNOSIS — Z0000 Encounter for general adult medical examination without abnormal findings: Secondary | ICD-10-CM | POA: Diagnosis not present

## 2021-09-23 DIAGNOSIS — E119 Type 2 diabetes mellitus without complications: Secondary | ICD-10-CM | POA: Diagnosis not present

## 2021-09-23 DIAGNOSIS — Z9884 Bariatric surgery status: Secondary | ICD-10-CM | POA: Diagnosis not present

## 2021-09-23 DIAGNOSIS — Z23 Encounter for immunization: Secondary | ICD-10-CM

## 2021-09-23 LAB — TSH: TSH: 2.67 u[IU]/mL (ref 0.35–5.50)

## 2021-09-23 LAB — CBC
HCT: 42.4 % (ref 39.0–52.0)
Hemoglobin: 14.4 g/dL (ref 13.0–17.0)
MCHC: 33.9 g/dL (ref 30.0–36.0)
MCV: 89.7 fl (ref 78.0–100.0)
Platelets: 102 10*3/uL — ABNORMAL LOW (ref 150.0–400.0)
RBC: 4.73 Mil/uL (ref 4.22–5.81)
RDW: 13.8 % (ref 11.5–15.5)
WBC: 4.9 10*3/uL (ref 4.0–10.5)

## 2021-09-23 LAB — COMPREHENSIVE METABOLIC PANEL
ALT: 45 U/L (ref 0–53)
AST: 25 U/L (ref 0–37)
Albumin: 4.2 g/dL (ref 3.5–5.2)
Alkaline Phosphatase: 112 U/L (ref 39–117)
BUN: 14 mg/dL (ref 6–23)
CO2: 32 mEq/L (ref 19–32)
Calcium: 9.3 mg/dL (ref 8.4–10.5)
Chloride: 102 mEq/L (ref 96–112)
Creatinine, Ser: 0.91 mg/dL (ref 0.40–1.50)
GFR: 89.09 mL/min (ref >60.00)
Glucose, Bld: 133 mg/dL — ABNORMAL HIGH (ref 70–99)
Potassium: 3.9 mEq/L (ref 3.5–5.1)
Sodium: 141 mEq/L (ref 135–145)
Total Bilirubin: 0.9 mg/dL (ref 0.2–1.2)
Total Protein: 6.8 g/dL (ref 6.0–8.3)

## 2021-09-23 LAB — LIPID PANEL
Cholesterol: 151 mg/dL (ref 0–200)
HDL: 45.6 mg/dL (ref >39.00)
LDL Cholesterol: 80 mg/dL (ref 0–99)
NonHDL: 105.42
Total CHOL/HDL Ratio: 3
Triglycerides: 127 mg/dL (ref 0.0–149.0)
VLDL: 25.4 mg/dL (ref 0.0–40.0)

## 2021-09-23 LAB — PSA: PSA: 0.59 ng/mL (ref 0.10–4.00)

## 2021-09-23 LAB — FERRITIN: Ferritin: 154.4 ng/mL (ref 22.0–322.0)

## 2021-09-23 LAB — HEMOGLOBIN A1C: Hgb A1c MFr Bld: 6.2 % (ref 4.6–6.5)

## 2021-09-23 LAB — B12 AND FOLATE PANEL
Folate: >23.4 ng/mL (ref >5.9)
Vitamin B-12: >1550 pg/mL — ABNORMAL HIGH (ref 211–911)

## 2021-09-23 MED ORDER — ATORVASTATIN CALCIUM 40 MG PO TABS: ORAL_TABLET | ORAL | 3 refills | Status: DC

## 2021-09-23 MED ORDER — RAMIPRIL 2.5 MG PO CAPS: ORAL_CAPSULE | ORAL | 3 refills | Status: DC

## 2021-09-23 MED ORDER — LEVOTHYROXINE SODIUM 150 MCG PO TABS: ORAL_TABLET | ORAL | 3 refills | Status: DC

## 2021-09-23 NOTE — Addendum Note (Signed)
Addended by: Lamar Blinks C on: 09/23/2021 08:04 PM   Modules accepted: Orders

## 2021-10-08 ENCOUNTER — Other Ambulatory Visit: Payer: Self-pay

## 2021-10-08 DIAGNOSIS — K635 Polyp of colon: Secondary | ICD-10-CM | POA: Insufficient documentation

## 2021-10-08 DIAGNOSIS — K602 Anal fissure, unspecified: Secondary | ICD-10-CM | POA: Insufficient documentation

## 2021-10-08 DIAGNOSIS — E119 Type 2 diabetes mellitus without complications: Secondary | ICD-10-CM | POA: Insufficient documentation

## 2021-10-08 DIAGNOSIS — T7840XA Allergy, unspecified, initial encounter: Secondary | ICD-10-CM | POA: Insufficient documentation

## 2021-10-08 DIAGNOSIS — E785 Hyperlipidemia, unspecified: Secondary | ICD-10-CM | POA: Insufficient documentation

## 2021-10-08 DIAGNOSIS — K760 Fatty (change of) liver, not elsewhere classified: Secondary | ICD-10-CM | POA: Insufficient documentation

## 2021-10-08 DIAGNOSIS — I519 Heart disease, unspecified: Secondary | ICD-10-CM | POA: Insufficient documentation

## 2021-10-08 DIAGNOSIS — H1045 Other chronic allergic conjunctivitis: Secondary | ICD-10-CM | POA: Insufficient documentation

## 2021-10-08 DIAGNOSIS — I10 Essential (primary) hypertension: Secondary | ICD-10-CM | POA: Insufficient documentation

## 2021-10-08 DIAGNOSIS — J309 Allergic rhinitis, unspecified: Secondary | ICD-10-CM | POA: Insufficient documentation

## 2021-10-08 DIAGNOSIS — E039 Hypothyroidism, unspecified: Secondary | ICD-10-CM | POA: Insufficient documentation

## 2021-10-08 HISTORY — DX: Other chronic allergic conjunctivitis: H10.45

## 2021-10-08 HISTORY — DX: Allergic rhinitis, unspecified: J30.9

## 2021-10-08 HISTORY — DX: Type 2 diabetes mellitus without complications: E11.9

## 2021-10-12 ENCOUNTER — Encounter: Payer: Self-pay | Admitting: Cardiology

## 2021-10-12 ENCOUNTER — Other Ambulatory Visit: Payer: Self-pay

## 2021-10-12 ENCOUNTER — Ambulatory Visit (INDEPENDENT_AMBULATORY_CARE_PROVIDER_SITE_OTHER): Payer: BC Managed Care – PPO | Admitting: Cardiology

## 2021-10-12 VITALS — BP 126/74 | HR 64 | Ht 71.0 in | Wt 310.1 lb

## 2021-10-12 DIAGNOSIS — I10 Essential (primary) hypertension: Secondary | ICD-10-CM | POA: Diagnosis not present

## 2021-10-12 DIAGNOSIS — I2510 Atherosclerotic heart disease of native coronary artery without angina pectoris: Secondary | ICD-10-CM | POA: Diagnosis not present

## 2021-10-12 DIAGNOSIS — E119 Type 2 diabetes mellitus without complications: Secondary | ICD-10-CM | POA: Diagnosis not present

## 2021-10-12 DIAGNOSIS — E782 Mixed hyperlipidemia: Secondary | ICD-10-CM

## 2021-10-12 NOTE — Progress Notes (Signed)
Cardiology Office Note:    Date:  10/12/2021   ID:  Scott Bauer, DOB January 09, 1956, MRN [id number]  PCP:  Darreld Mclean, MD  Cardiologist:  Jenean Lindau, MD   Referring MD: Darreld Mclean, MD    ASSESSMENT:    1. Coronary artery disease involving native coronary artery of native heart without angina pectoris   2. Essential hypertension   3. Mixed hyperlipidemia   4. Diabetes mellitus without complication (HCC)    PLAN:    In order of problems listed above:  Coronary artery disease: Secondary prevention stressed with the patient.  Importance of compliance with diet medication stressed and vocalized understanding.  He was advised to walk at least half an hour a day 5 days a week and he promises to do so. Essential hypertension: Blood pressure stable and diet was emphasized. Mixed dyslipidemia: Diet emphasized.  Lifestyle modification urged.  Lipids reviewed. Morbid obesity: Risks of obesity explained.  Patient has not been very compliant with follow-up on the advice given last time.  He tells me that he had been unwell but he had a trip out of town for a couple of weeks which threw him off and is doing to do his best to follow directions about diet.  I told him we will be glad to assist him in any way we can.  He is to stay focused with diet and Mediterranean diet was discussed.  Exercise combined with dietary approach would be very helpful and he was successful when he began this program a few months ago.  He promises to do better. Patient will be seen in follow-up appointment in 6 months or earlier if the patient has any concerns    Medication Adjustments/Labs and Tests Ordered: Current medicines are reviewed at length with the patient today.  Concerns regarding medicines are outlined above.  Orders Placed This Encounter  Procedures   EKG 12-Lead   No orders of the defined types were placed in this encounter.    No chief complaint on file.    History of  Present Illness:    Scott Bauer is a 65 y.o. male.  Patient has past medical history of coronary artery disease, essential hypertension, mixed dyslipidemia and morbid obesity.  He denies any problems at this time and takes care of activities of daily living.  No chest pain orthopnea or PND.  At the time of my evaluation, the patient is alert awake oriented and in no distress.  He is living a sedentary lifestyle and has not been very compliant with diet instructions.  Past Medical History:  Diagnosis Date   Acquired hypothyroidism 12/16/2015   Allergic rhinitis 10/08/2021   Allergy    Anal fissure    Chronic allergic conjunctivitis 10/08/2021   Colon polyp    Coronary artery disease involving native coronary artery of native heart without angina pectoris 05/22/2015   Diabetes mellitus type 2 in obese ([place]) 05/30/2017   Diabetes mellitus without complication ([place])    type 2  on no meds   Diet-controlled diabetes mellitus ([place]) 01/03/2019   Erectile dysfunction 04/09/2018   Essential hypertension 05/30/2017   Family history of abdominal aortic aneurysm (AAA) 12/16/2015   Fatty liver    H/O bariatric surgery 06/08/2017   Heart disease    History of MI (myocardial infarction) 01/03/2019   History of right coronary artery stent placement 07/01/2017   Hypercholesterolemia 05/30/2017   Hyperlipidemia    Hypertension    Hypothyroidism  Idiopathic thrombocytopenia ([place]) 01/03/2019   Morbid obesity ([place]) 05/30/2017   Obesity, Class III, BMI 40-49.9 (morbid obesity) ([place]) 05/30/2017   Old MI (myocardial infarction) 05/22/2015   OSA on CPAP 05/30/2017   Pneumonia 2000   Postablative hypothyroidism 01/03/2019   Rhinitis, chronic 07/11/2017   Last Assessment & Plan:  Concern over profuse rhinorrhea. He has intermittent times where his nose will run constantly.  Typically starts with a sneeze.  And then it goes away spontaneously.  He uses Flonase on a regular basis.  Unfortunately, this does not control his  symptoms and he presents to discuss other options.  He feels his rhinorrhea is bilateral. EXAMINATION reveals no intranasal polyps   Testicular hypofunction 12/16/2015   Thrombocytopenia, unspecified ([place]) 12/16/2015    Past Surgical History:  Procedure Laterality Date   ANAL FISSURE REPAIR     CARDIAC CATHETERIZATION     COLOSTOMY  2015   LAPAROSCOPIC GASTRIC SLEEVE RESECTION N/A 05/30/2017   Procedure: LAPAROSCOPIC GASTRIC SLEEVE RESECTION, UPPER ENDO;  Surgeon: Greer Pickerel, MD;  Location: [facility];  Service: General;  Laterality: N/A;   NASAL SINUS SURGERY      Current Medications: Current Meds  Medication Sig   aspirin EC 81 MG tablet Take 81 mg by mouth daily.   atorvastatin (LIPITOR) 40 MG tablet TAKE 1 TABLET(40 MG) BY MOUTH DAILY   carvedilol (COREG) 6.25 MG tablet Take 6.25 mg by mouth 2 (two) times daily with a meal.   cetirizine (ZYRTEC) 10 MG tablet Take 10 mg by mouth daily.   Cholecalciferol (VITAMIN D-3) 5000 units TABS Take 5,000 Units by mouth every evening.   Coenzyme Q10 (COQ10) 100 MG CAPS Take 100 mg by mouth daily.   levothyroxine (SYNTHROID) 150 MCG tablet TAKE 1 TABLET(150 MCG) BY MOUTH DAILY BEFORE BREAKFAST   nitroGLYCERIN (NITROSTAT) 0.4 MG SL tablet Place 0.4 mg under the tongue every 5 (five) minutes as needed for chest pain.   ramipril (ALTACE) 2.5 MG capsule TAKE 1 CAPSULE(2.5 MG) BY MOUTH DAILY   UNABLE TO FIND Inhale 15 mLs into the lungs as needed for wheezing or shortness of breath.  Lpratropium 0.06%   UNABLE TO FIND Take 500 mg by mouth daily.  Mega Red 500 mg    UNABLE TO FIND Take 500 mg by mouth 3 (three) times daily. Med Name: Calcium Citrate   UNABLE TO FIND Take 1 tablet by mouth daily.  Bariatric Advantage    vitamin E 400 UNIT capsule Take 400 Units by mouth daily.     Allergies:   Cortisone and Prednisone   Social History   Socioeconomic History   Marital status: Married    Spouse name: Not on file   Number of children: Not on file    Years of education: Not on file   Highest education level: Not on file  Occupational History   Not on file  Tobacco Use   Smoking status: Some Days    Types: Cigarettes, Cigars    Last attempt to quit: 2008    Years since quitting: 14.8   Smokeless tobacco: Never   Tobacco comments:    occasionally   Vaping Use   Vaping Use: Never used  Substance and Sexual Activity   Alcohol use: Yes    Alcohol/week: 1.0 standard drink    Types: 1 Cans of beer per week    Comment: beer occasionally , sometimes a mixed drink    Drug use: No    Comment: occasionally "marijuana gummie"  out of town   Sexual activity: Not on file  Other Topics Concern   Not on file  Social History Narrative   Not on file   Social Determinants of Health   Financial Resource Strain: Not on file  Food Insecurity: Not on file  Transportation Needs: Not on file  Physical Activity: Not on file  Stress: Not on file  Social Connections: Not on file     Family History: The patient's family history includes Alcohol abuse in his father; Arthritis in his brother, father, and maternal grandfather; Cancer in his father and another family member; Depression in his mother and sister; Diabetes in his mother and another family member; Drug abuse in his sister; Early death in his sister; Heart attack in his brother, father, and maternal grandmother; Heart disease in his sister; Hyperlipidemia in his sister; Hypertension in his sister and another family member; Kidney disease in his mother; Miscarriages / [place] in his mother. There is no history of Colon cancer, Colon polyps, Esophageal cancer, Stomach cancer, or Rectal cancer.  ROS:   Please see the history of present illness.    All other systems reviewed and are negative.  EKGs/Labs/Other Studies Reviewed:    The following studies were reviewed today: I discussed my findings with the patient at length.  EKG reveals sinus rhythm and nonspecific ST-T  changes.   Recent Labs: 09/23/2021: ALT 45; BUN 14; Creatinine, Ser 0.91; Hemoglobin 14.4; Platelets 102.0; Potassium 3.9; Sodium 141; TSH 2.67  Recent Lipid Panel    Component Value Date/Time   CHOL 151 09/23/2021 0842   CHOL 110 10/03/2020 0843   TRIG 127.0 09/23/2021 0842   HDL 45.60 09/23/2021 0842   HDL 38 (L) 10/03/2020 0843   CHOLHDL 3 09/23/2021 0842   VLDL 25.4 09/23/2021 0842   LDLCALC 80 09/23/2021 0842   LDLCALC 57 10/03/2020 0843    Physical Exam:    VS:  BP 126/74   Pulse 64   Ht 5\' 11"  (1.803 m)   Wt (!) 310 lb 1.9 oz (140.7 kg)   SpO2 98%   BMI 43.25 kg/m     Wt Readings from Last 3 Encounters:  10/12/21 (!) 310 lb 1.9 oz (140.7 kg)  09/23/21 (!) 309 lb 12.8 oz (140.5 kg)  08/22/20 299 lb (135.6 kg)     GEN: Patient is in no acute distress HEENT: Normal NECK: No JVD; No carotid bruits LYMPHATICS: No lymphadenopathy CARDIAC: Hear sounds regular, 2/6 systolic murmur at the apex. RESPIRATORY:  Clear to auscultation without rales, wheezing or rhonchi  ABDOMEN: Soft, non-tender, non-distended MUSCULOSKELETAL:  No edema; No deformity  SKIN: Warm and dry NEUROLOGIC:  Alert and oriented x 3 PSYCHIATRIC:  Normal affect   Signed, Jenean Lindau, MD  10/12/2021 3:32 PM    [facility]

## 2021-10-12 NOTE — Patient Instructions (Signed)

## 2021-11-04 DIAGNOSIS — I2510 Atherosclerotic heart disease of native coronary artery without angina pectoris: Secondary | ICD-10-CM | POA: Diagnosis not present

## 2021-11-04 DIAGNOSIS — G4733 Obstructive sleep apnea (adult) (pediatric): Secondary | ICD-10-CM | POA: Diagnosis not present

## 2021-11-04 DIAGNOSIS — Z9989 Dependence on other enabling machines and devices: Secondary | ICD-10-CM | POA: Diagnosis not present

## 2021-11-04 DIAGNOSIS — R635 Abnormal weight gain: Secondary | ICD-10-CM | POA: Diagnosis not present

## 2021-11-27 ENCOUNTER — Telehealth: Payer: Self-pay | Admitting: Family Medicine

## 2021-11-27 ENCOUNTER — Other Ambulatory Visit: Payer: Self-pay

## 2021-11-27 MED ORDER — LEVOTHYROXINE SODIUM 150 MCG PO TABS: ORAL_TABLET | ORAL | 3 refills | Status: DC

## 2021-11-27 NOTE — Telephone Encounter (Signed)
Refill sent.

## 2021-11-27 NOTE — Telephone Encounter (Signed)
Pharmacy stated they dont have refill in system  Medication: levothyroxine (SYNTHROID) 150 MCG tablet   Has the patient contacted their pharmacy? No. (If no, request that the patient contact the pharmacy for the refill.) (If yes, when and what did the pharmacy advise?)  Preferred Pharmacy (with phone number or street name): [id number] - [place], [place] - [address]  [address], [place] [address]  Phone:  [phone]  Fax:  [phone]   Agent: Please be advised that RX refills may take up to 3 business days. We ask that you follow-up with your pharmacy.

## 2021-12-21 DIAGNOSIS — Z9989 Dependence on other enabling machines and devices: Secondary | ICD-10-CM | POA: Diagnosis not present

## 2021-12-21 DIAGNOSIS — G4733 Obstructive sleep apnea (adult) (pediatric): Secondary | ICD-10-CM | POA: Diagnosis not present

## 2021-12-24 ENCOUNTER — Encounter (HOSPITAL_COMMUNITY): Payer: Self-pay | Admitting: *Deleted

## 2022-01-19 DIAGNOSIS — G4733 Obstructive sleep apnea (adult) (pediatric): Secondary | ICD-10-CM | POA: Diagnosis not present

## 2022-01-19 DIAGNOSIS — Z9989 Dependence on other enabling machines and devices: Secondary | ICD-10-CM | POA: Diagnosis not present

## 2022-02-16 DIAGNOSIS — G4733 Obstructive sleep apnea (adult) (pediatric): Secondary | ICD-10-CM | POA: Diagnosis not present

## 2022-02-16 DIAGNOSIS — Z9989 Dependence on other enabling machines and devices: Secondary | ICD-10-CM | POA: Diagnosis not present

## 2022-03-20 DIAGNOSIS — Z9989 Dependence on other enabling machines and devices: Secondary | ICD-10-CM | POA: Diagnosis not present

## 2022-03-20 DIAGNOSIS — G4733 Obstructive sleep apnea (adult) (pediatric): Secondary | ICD-10-CM | POA: Diagnosis not present

## 2022-03-29 ENCOUNTER — Encounter: Payer: Self-pay | Admitting: Gastroenterology

## 2022-04-19 DIAGNOSIS — G4733 Obstructive sleep apnea (adult) (pediatric): Secondary | ICD-10-CM | POA: Diagnosis not present

## 2022-04-19 DIAGNOSIS — Z9989 Dependence on other enabling machines and devices: Secondary | ICD-10-CM | POA: Diagnosis not present

## 2022-05-13 ENCOUNTER — Ambulatory Visit: Payer: BC Managed Care – PPO | Admitting: Cardiology

## 2022-05-20 DIAGNOSIS — Z9989 Dependence on other enabling machines and devices: Secondary | ICD-10-CM | POA: Diagnosis not present

## 2022-05-20 DIAGNOSIS — G4733 Obstructive sleep apnea (adult) (pediatric): Secondary | ICD-10-CM | POA: Diagnosis not present

## 2022-05-27 ENCOUNTER — Ambulatory Visit: Payer: BC Managed Care – PPO | Admitting: Cardiology

## 2022-06-13 ENCOUNTER — Other Ambulatory Visit: Payer: Self-pay | Admitting: Family Medicine

## 2022-06-13 DIAGNOSIS — Z13220 Encounter for screening for lipoid disorders: Secondary | ICD-10-CM

## 2022-06-13 DIAGNOSIS — I252 Old myocardial infarction: Secondary | ICD-10-CM

## 2022-06-21 ENCOUNTER — Ambulatory Visit: Payer: BC Managed Care – PPO | Admitting: Cardiology

## 2022-07-14 DIAGNOSIS — Z9989 Dependence on other enabling machines and devices: Secondary | ICD-10-CM | POA: Diagnosis not present

## 2022-07-14 DIAGNOSIS — I2510 Atherosclerotic heart disease of native coronary artery without angina pectoris: Secondary | ICD-10-CM | POA: Diagnosis not present

## 2022-07-14 DIAGNOSIS — G4733 Obstructive sleep apnea (adult) (pediatric): Secondary | ICD-10-CM | POA: Diagnosis not present

## 2022-07-14 DIAGNOSIS — Z955 Presence of coronary angioplasty implant and graft: Secondary | ICD-10-CM | POA: Diagnosis not present

## 2022-07-30 DIAGNOSIS — H1045 Other chronic allergic conjunctivitis: Secondary | ICD-10-CM | POA: Diagnosis not present

## 2022-07-30 DIAGNOSIS — J3089 Other allergic rhinitis: Secondary | ICD-10-CM | POA: Diagnosis not present

## 2022-08-16 ENCOUNTER — Ambulatory Visit: Payer: BC Managed Care – PPO | Admitting: Cardiology

## 2022-09-16 DIAGNOSIS — G4733 Obstructive sleep apnea (adult) (pediatric): Secondary | ICD-10-CM | POA: Diagnosis not present

## 2022-09-16 DIAGNOSIS — Z9989 Dependence on other enabling machines and devices: Secondary | ICD-10-CM | POA: Diagnosis not present

## 2022-09-23 ENCOUNTER — Encounter: Payer: Self-pay | Admitting: Cardiology

## 2022-09-23 ENCOUNTER — Ambulatory Visit: Payer: BC Managed Care – PPO | Attending: Cardiology | Admitting: Cardiology

## 2022-09-23 VITALS — BP 118/72 | HR 62 | Ht 71.0 in | Wt 314.0 lb

## 2022-09-23 DIAGNOSIS — I2510 Atherosclerotic heart disease of native coronary artery without angina pectoris: Secondary | ICD-10-CM | POA: Diagnosis not present

## 2022-09-23 DIAGNOSIS — E119 Type 2 diabetes mellitus without complications: Secondary | ICD-10-CM | POA: Diagnosis not present

## 2022-09-23 DIAGNOSIS — I10 Essential (primary) hypertension: Secondary | ICD-10-CM | POA: Diagnosis not present

## 2022-09-23 DIAGNOSIS — E782 Mixed hyperlipidemia: Secondary | ICD-10-CM

## 2022-09-23 NOTE — Progress Notes (Signed)
Cardiology Office Note:    Date:  09/23/2022   ID:  Scott Bauer, DOB 01-20-1956, MRN [id number]  PCP:  Darreld Mclean, MD  Cardiologist:  Jenean Lindau, MD   Referring MD: Darreld Mclean, MD    ASSESSMENT:    1. Coronary artery disease involving native coronary artery of native heart without angina pectoris   2. Essential hypertension   3. Diabetes mellitus without complication ([place])   4. Morbid obesity ([place])   5. Mixed hyperlipidemia    PLAN:    In order of problems listed above:  Coronary artery disease: Secondary prevention stressed with the patient.  Importance of compliance with diet medication stressed and he vocalized understanding.  He was advised to walk and ambulate to the best of his ability. Essential hypertension: Blood pressure is stable and diet was emphasized. Mixed dyslipidemia: On lipid-lowering medications.  Diet emphasized.  He will have blood work done in the next few days with primary care and send [place] a copy. Morbid obesity: Risks of obesity explained and he promises to do better. Patient will be seen in follow-up appointment in 12 months or earlier if the patient has any concerns    Medication Adjustments/Labs and Tests Ordered: Current medicines are reviewed at length with the patient today.  Concerns regarding medicines are outlined above.  No orders of the defined types were placed in this encounter.  No orders of the defined types were placed in this encounter.    No chief complaint on file.    History of Present Illness:    Scott Bauer is a 66 y.o. male.  Patient has past medical history of coronary artery disease, essential hypertension, dyslipidemia and morbid obesity.  He denies any problems at this time and takes care of activities of daily living.  No chest pain orthopnea or PND.  At the time of my evaluation, the patient is alert awake oriented and in no distress.  He leads a sedentary lifestyle.  He has gained  weight.  Past Medical History:  Diagnosis Date   Acquired hypothyroidism 12/16/2015   Allergic rhinitis 10/08/2021   Allergy    Anal fissure    Chronic allergic conjunctivitis 10/08/2021   Colon polyp    Coronary artery disease involving native coronary artery of native heart without angina pectoris 05/22/2015   Diabetes mellitus type 2 in obese ([place]) 05/30/2017   Diabetes mellitus without complication ([place])    type 2  on no meds   Diet-controlled diabetes mellitus ([place]) 01/03/2019   Erectile dysfunction 04/09/2018   Essential hypertension 05/30/2017   Family history of abdominal aortic aneurysm (AAA) 12/16/2015   Fatty liver    H/O bariatric surgery 06/08/2017   Heart disease    History of MI (myocardial infarction) 01/03/2019   History of right coronary artery stent placement 07/01/2017   Hypercholesterolemia 05/30/2017   Hyperlipidemia    Hypertension    Hypothyroidism    Idiopathic thrombocytopenia ([place]) 01/03/2019   Morbid obesity ([place]) 05/30/2017   Obesity, Class III, BMI 40-49.9 (morbid obesity) ([place]) 05/30/2017   Old MI (myocardial infarction) 05/22/2015   OSA on CPAP 05/30/2017   Pneumonia 2000   Postablative hypothyroidism 01/03/2019   Rhinitis, chronic 07/11/2017   Last Assessment & Plan:  Concern over profuse rhinorrhea. He has intermittent times where his nose will run constantly.  Typically starts with a sneeze.  And then it goes away spontaneously.  He uses Flonase on a regular basis.  Unfortunately, this does not  control his symptoms and he presents to discuss other options.  He feels his rhinorrhea is bilateral. EXAMINATION reveals no intranasal polyps   Testicular hypofunction 12/16/2015   Thrombocytopenia, unspecified ([place]) 12/16/2015    Past Surgical History:  Procedure Laterality Date   ANAL FISSURE REPAIR     CARDIAC CATHETERIZATION     COLOSTOMY  2015   LAPAROSCOPIC GASTRIC SLEEVE RESECTION N/A 05/30/2017   Procedure: LAPAROSCOPIC GASTRIC SLEEVE RESECTION, UPPER ENDO;   Surgeon: Greer Pickerel, MD;  Location: [facility];  Service: General;  Laterality: N/A;   NASAL SINUS SURGERY      Current Medications: Current Meds  Medication Sig   aspirin EC 81 MG tablet Take 81 mg by mouth daily.   atorvastatin (LIPITOR) 40 MG tablet TAKE 1 TABLET(40 MG) BY MOUTH DAILY   carvedilol (COREG) 6.25 MG tablet Take 6.25 mg by mouth 2 (two) times daily with a meal.   cetirizine (ZYRTEC) 10 MG tablet Take 10 mg by mouth daily.   Cholecalciferol (VITAMIN D-3) 5000 units TABS Take 5,000 Units by mouth every evening.   Coenzyme Q10 (COQ10) 100 MG CAPS Take 100 mg by mouth daily.   levothyroxine (SYNTHROID) 150 MCG tablet TAKE 1 TABLET(150 MCG) BY MOUTH DAILY BEFORE BREAKFAST   nitroGLYCERIN (NITROSTAT) 0.4 MG SL tablet Place 0.4 mg under the tongue every 5 (five) minutes as needed for chest pain.   ramipril (ALTACE) 2.5 MG capsule TAKE 1 CAPSULE(2.5 MG) BY MOUTH DAILY   UNABLE TO FIND Take 500 mg by mouth daily.  Mega Red 500 mg    UNABLE TO FIND Take 500 mg by mouth 3 (three) times daily. Med Name: Calcium Citrate   UNABLE TO FIND Take 1 tablet by mouth daily.  Bariatric Advantage    vitamin E 400 UNIT capsule Take 400 Units by mouth daily.     Allergies:   Cortisone and Prednisone   Social History   Socioeconomic History   Marital status: Married    Spouse name: Not on file   Number of children: Not on file   Years of education: Not on file   Highest education level: Not on file  Occupational History   Not on file  Tobacco Use   Smoking status: Some Days    Types: Cigarettes, Cigars    Last attempt to quit: 2008    Years since quitting: 15.8   Smokeless tobacco: Never   Tobacco comments:    occasionally   Vaping Use   Vaping Use: Never used  Substance and Sexual Activity   Alcohol use: Yes    Alcohol/week: 1.0 standard drink of alcohol    Types: 1 Cans of beer per week    Comment: beer occasionally , sometimes a mixed drink    Drug use: No    Comment:  occasionally "marijuana gummie" out of town   Sexual activity: Not on file  Other Topics Concern   Not on file  Social History Narrative   Not on file   Social Determinants of Health   Financial Resource Strain: Not on file  Food Insecurity: Not on file  Transportation Needs: Not on file  Physical Activity: Not on file  Stress: Not on file  Social Connections: Not on file     Family History: The patient's family history includes Alcohol abuse in his father; Arthritis in his brother, father, and maternal grandfather; Cancer in his father and another family member; Depression in his mother and sister; Diabetes in his mother and another  family member; Drug abuse in his sister; Early death in his sister; Heart attack in his brother, father, and maternal grandmother; Heart disease in his sister; Hyperlipidemia in his sister; Hypertension in his sister and another family member; Kidney disease in his mother; Miscarriages / [place] in his mother. There is no history of Colon cancer, Colon polyps, Esophageal cancer, Stomach cancer, or Rectal cancer.  ROS:   Please see the history of present illness.    All other systems reviewed and are negative.  EKGs/Labs/Other Studies Reviewed:    The following studies were reviewed today: I discussed my findings with the patient at length.   Recent Labs: No results found for requested labs within last 365 days.  Recent Lipid Panel    Component Value Date/Time   CHOL 151 09/23/2021 0842   CHOL 110 10/03/2020 0843   TRIG 127.0 09/23/2021 0842   HDL 45.60 09/23/2021 0842   HDL 38 (L) 10/03/2020 0843   CHOLHDL 3 09/23/2021 0842   VLDL 25.4 09/23/2021 0842   LDLCALC 80 09/23/2021 0842   LDLCALC 57 10/03/2020 0843    Physical Exam:    VS:  BP 118/72   Pulse 62   Ht '5\' 11"'$  (1.803 m)   Wt (!) 314 lb 0.6 oz (142.4 kg)   SpO2 96%   BMI 43.80 kg/m     Wt Readings from Last 3 Encounters:  09/23/22 (!) 314 lb 0.6 oz (142.4 kg)  10/12/21  (!) 310 lb 1.9 oz (140.7 kg)  09/23/21 (!) 309 lb 12.8 oz (140.5 kg)     GEN: Patient is in no acute distress HEENT: Normal NECK: No JVD; No carotid bruits LYMPHATICS: No lymphadenopathy CARDIAC: Hear sounds regular, 2/6 systolic murmur at the apex. RESPIRATORY:  Clear to auscultation without rales, wheezing or rhonchi  ABDOMEN: Soft, non-tender, non-distended MUSCULOSKELETAL:  No edema; No deformity  SKIN: Warm and dry NEUROLOGIC:  Alert and oriented x 3 PSYCHIATRIC:  Normal affect   Signed, Jenean Lindau, MD  09/23/2022 10:01 AM    [place]

## 2022-09-23 NOTE — Patient Instructions (Signed)

## 2022-10-06 ENCOUNTER — Encounter: Payer: Self-pay | Admitting: Family Medicine

## 2022-10-12 NOTE — Progress Notes (Signed)
[place] at [facility] [address], [place], [place] [address] [phone]  Date:  10/18/2022   Name:  Scott Bauer   DOB:  04/12/1956   MRN:  [id number]  PCP:  Darreld Mclean, MD    Chief Complaint: Annual Exam (Concerns/ questions: 1. pt is still recovering from viral infection x 2 weeks ago. 2.L heal pain, come and go. [place] exam:  pt is  due/Flu shot today: [facility]- 09/18/2022)   History of Present Illness:  MCDANIEL OHMS is a 66 y.o. very pleasant male patient who presents with the following:  Pt seen today for a CPE Last seen by myself  about one year ago  History of MI, HTN, OSA, hyperlipidemia, DM, hypothymism S/p PCI in 2008 and 2010 Gastric sleeve about 5 years ago- he lost quite a bit of weight but has gained a lot of it back.  Max weight about 350  Wt Readings from Last 3 Encounters:  10/18/22 (!) 318 lb (144.2 kg)  09/23/22 (!) 314 lb 0.6 oz (142.4 kg)  10/12/21 (!) 310 lb 1.9 oz (140.7 kg)    Seen by pulmonology in August  Cardiology in November: Coronary artery disease: Secondary prevention stressed with the patient.  Importance of compliance with diet medication stressed and he vocalized understanding.  He was advised to walk and ambulate to the best of his ability. Essential hypertension: Blood pressure is stable and diet was emphasized. Mixed dyslipidemia: On lipid-lowering medications.  Diet emphasized.  He will have blood work done in the next few days with primary care and send [place] a copy. Morbid obesity: Risks of obesity explained and he promises to do better. Patient will be seen in follow-up appointment in 12 months or earlier if the patient has any concerns  Urine micro- today  Colon cancer screening-  this is due, reminded pt  Eye exam- done annually, [facility] -he is not sure of the exact date A1c is due-update today Flu shot- done already Covid booster- recommended  Foot exam  is due Labs one year ago  Shingrix done  Needs AAA screening for history of smoking  Lab Results  Component Value Date   HGBA1C 7.9 (H) 10/18/2022   He is interested in a GLP-1 for weight loss and diabetes control No contraindication, he is comfortable doing home-based injections Asa Lipitor Coreg Synthroid 150 Ramipril     Patient Active Problem List   Diagnosis Date Noted   Colon polyp 10/08/2021   Fatty liver 10/08/2021   Hypothyroidism 10/08/2021   Allergic rhinitis 10/08/2021   Idiopathic thrombocytopenia ([place]) 01/03/2019   History of MI (myocardial infarction) 01/03/2019   Erectile dysfunction 04/09/2018   History of right coronary artery stent placement 07/01/2017   H/O bariatric surgery 06/08/2017   Obesity, Class III, BMI 40-49.9 (morbid obesity) ([place]) 05/30/2017   Diabetes mellitus type 2 in obese ([place]) 05/30/2017   Essential hypertension 05/30/2017   OSA on CPAP 05/30/2017   Hypercholesterolemia 05/30/2017   Family history of abdominal aortic aneurysm (AAA) 12/16/2015   Testicular hypofunction 12/16/2015   Thrombocytopenia, unspecified ([place]) 12/16/2015   Coronary artery disease involving native coronary artery of native heart without angina pectoris 05/22/2015   Old MI (myocardial infarction) 05/22/2015    Past Medical History:  Diagnosis Date   Acquired hypothyroidism 12/16/2015   Allergic rhinitis 10/08/2021   Allergy    Anal fissure    Chronic allergic conjunctivitis 10/08/2021  Colon polyp    Coronary artery disease involving native coronary artery of native heart without angina pectoris 05/22/2015   Diabetes mellitus type 2 in obese ([place]) 05/30/2017   Diabetes mellitus without complication ([place])    type 2  on no meds   Diet-controlled diabetes mellitus ([place]) 01/03/2019   Erectile dysfunction 04/09/2018   Essential hypertension 05/30/2017   Family history of abdominal aortic aneurysm (AAA) 12/16/2015   Fatty liver    H/O bariatric surgery 06/08/2017    Heart disease    History of MI (myocardial infarction) 01/03/2019   History of right coronary artery stent placement 07/01/2017   Hypercholesterolemia 05/30/2017   Hyperlipidemia    Hypertension    Hypothyroidism    Idiopathic thrombocytopenia ([place]) 01/03/2019   Morbid obesity ([place]) 05/30/2017   Obesity, Class III, BMI 40-49.9 (morbid obesity) ([place]) 05/30/2017   Old MI (myocardial infarction) 05/22/2015   OSA on CPAP 05/30/2017   Pneumonia 2000   Postablative hypothyroidism 01/03/2019   Rhinitis, chronic 07/11/2017   Last Assessment & Plan:  Concern over profuse rhinorrhea. He has intermittent times where his nose will run constantly.  Typically starts with a sneeze.  And then it goes away spontaneously.  He uses Flonase on a regular basis.  Unfortunately, this does not control his symptoms and he presents to discuss other options.  He feels his rhinorrhea is bilateral. EXAMINATION reveals no intranasal polyps   Testicular hypofunction 12/16/2015   Thrombocytopenia, unspecified ([place]) 12/16/2015    Past Surgical History:  Procedure Laterality Date   ANAL FISSURE REPAIR     CARDIAC CATHETERIZATION     COLOSTOMY  2015   LAPAROSCOPIC GASTRIC SLEEVE RESECTION N/A 05/30/2017   Procedure: LAPAROSCOPIC GASTRIC SLEEVE RESECTION, UPPER ENDO;  Surgeon: Greer Pickerel, MD;  Location: [facility];  Service: General;  Laterality: N/A;   NASAL SINUS SURGERY      Social History   Tobacco Use   Smoking status: Some Days    Types: Cigarettes, Cigars    Last attempt to quit: 2008    Years since quitting: 15.9   Smokeless tobacco: Never   Tobacco comments:    occasionally   Vaping Use   Vaping Use: Never used  Substance Use Topics   Alcohol use: Yes    Alcohol/week: 1.0 standard drink of alcohol    Types: 1 Cans of beer per week    Comment: beer occasionally , sometimes a mixed drink    Drug use: No    Comment: occasionally "marijuana gummie" out of town    Family History  Problem Relation Age of Onset    Cancer Other    Hypertension Other    Diabetes Other    Depression Mother    Diabetes Mother    Kidney disease Mother    Miscarriages / [place] Mother    Alcohol abuse Father    Arthritis Father    Cancer Father    Heart attack Father    Heart disease Sister    Hyperlipidemia Sister    Hypertension Sister    Depression Sister    Drug abuse Sister    Early death Sister    Arthritis Brother    Heart attack Brother    Heart attack Maternal Grandmother    Arthritis Maternal Grandfather    Colon cancer Neg Hx    Colon polyps Neg Hx    Esophageal cancer Neg Hx    Stomach cancer Neg Hx    Rectal cancer Neg Hx  Allergies  Allergen Reactions   Cortisone    Prednisone Nausea Only and Other (See Comments)    Makes him feel funny  Only has issues when takes dose packs    Medication list has been reviewed and updated.  Current Outpatient Medications on File Prior to Visit  Medication Sig Dispense Refill   aspirin EC 81 MG tablet Take 81 mg by mouth daily.     atorvastatin (LIPITOR) 40 MG tablet TAKE 1 TABLET(40 MG) BY MOUTH DAILY 90 tablet 3   carvedilol (COREG) 6.25 MG tablet Take 6.25 mg by mouth 2 (two) times daily with a meal.     cetirizine (ZYRTEC) 10 MG tablet Take 10 mg by mouth daily.     Cholecalciferol (VITAMIN D-3) 5000 units TABS Take 5,000 Units by mouth every evening.     Coenzyme Q10 (COQ10) 100 MG CAPS Take 100 mg by mouth daily.     levothyroxine (SYNTHROID) 150 MCG tablet TAKE 1 TABLET(150 MCG) BY MOUTH DAILY BEFORE BREAKFAST 90 tablet 3   nitroGLYCERIN (NITROSTAT) 0.4 MG SL tablet Place 0.4 mg under the tongue every 5 (five) minutes as needed for chest pain.     ramipril (ALTACE) 2.5 MG capsule TAKE 1 CAPSULE(2.5 MG) BY MOUTH DAILY 90 capsule 3   UNABLE TO FIND Take 500 mg by mouth daily.  Mega Red 500 mg      UNABLE TO FIND Take 500 mg by mouth 3 (three) times daily. Med Name: Calcium Citrate     UNABLE TO FIND Take 1 tablet by mouth daily.  Bariatric  Advantage      vitamin E 400 UNIT capsule Take 400 Units by mouth daily.     No current facility-administered medications on file prior to visit.    Review of Systems:  As per HPI- otherwise negative.   Physical Examination: Vitals:   10/18/22 0843  BP: 130/78  Pulse: 64  Resp: 18  Temp: 97.7 F (36.5 C)  SpO2: 97%   Vitals:   10/18/22 0843  Weight: (!) 318 lb (144.2 kg)  Height: '5\' 11"'$  (1.803 m)   Body mass index is 44.35 kg/m. Ideal Body Weight: Weight in (lb) to have BMI = 25: 178.9  GEN: no acute distress.  Obese, looks well  HEENT: Atraumatic, Normocephalic.  Ears and Nose: No external deformity. CV: RRR, No M/G/R. No JVD. No thrill. No extra heart sounds. PULM: CTA B, no wheezes, crackles, rhonchi. No retractions. No resp. distress. No accessory muscle use. ABD: S, NT, ND, +BS. No rebound. No HSM. EXTR: No c/c/e PSYCH: Normally interactive. Conversant.  Foot exam:  normal but pulses are faint on exam   Assessment and Plan: Physical exam  Diet-controlled diabetes mellitus ([place]) - Plan: Comprehensive metabolic panel, Hemoglobin A1c, Microalbumin / creatinine urine ratio, Dulaglutide (TRULICITY) 4.81 EH/6.3JS SOPN  Postablative hypothyroidism - Plan: TSH  Screening for prostate cancer - Plan: PSA  Screening for hyperlipidemia - Plan: Lipid panel  Screening, deficiency anemia, iron - Plan: CBC  History of weight loss surgery - Plan: Ferritin, Folate, B12  Old MI (myocardial infarction)  Morbid obesity ([place]) - Plan: Dulaglutide (TRULICITY) 9.65 YO/3.7CH SOPN  Smoking history - Plan: CANCELED: US AORTA MEDICARE SCREENING Patient seen today for physical exam.  Encouraged exercise and healthy diet.  He has had a gastric sleeve, but unfortunate has gained quite a bit of weight back.  He does have diabetes.  He is interested in starting a GLP-1, I prescribed Trulicity 8.85.  Discussed weekly  injection, he feels comfortable doing this at home having used  testosterone injections in the past.  Advised that we can increase dose on a monthly basis as needed.  Discussed most frequent side effects.  No contraindication to GLP-1 Will need to refill thyroid once TSH comes in  Reminder to schedule colon -patient given letter from gastroenterologist Will plan further follow- up pending labs.  Signed Lamar Blinks, MD  Received labs as below, message to pt  Results for orders placed or performed in visit on 10/18/22  CBC  Result Value Ref Range   WBC 6.3 4.0 - 10.5 K/uL   RBC 4.59 4.22 - 5.81 Mil/uL   Platelets 131.0 (L) 150.0 - 400.0 K/uL   Hemoglobin 13.9 13.0 - 17.0 g/dL   HCT 40.3 39.0 - 52.0 %   MCV 88.0 78.0 - 100.0 fl   MCHC 34.4 30.0 - 36.0 g/dL   RDW 13.3 11.5 - 15.5 %  Comprehensive metabolic panel  Result Value Ref Range   Sodium 136 135 - 145 mEq/L   Potassium 3.8 3.5 - 5.1 mEq/L   Chloride 100 96 - 112 mEq/L   CO2 30 19 - 32 mEq/L   Glucose, Bld 152 (H) 70 - 99 mg/dL   BUN 12 6 - 23 mg/dL   Creatinine, Ser 0.83 0.40 - 1.50 mg/dL   Total Bilirubin 0.5 0.2 - 1.2 mg/dL   Alkaline Phosphatase 136 (H) 39 - 117 U/L   AST 28 0 - 37 U/L   ALT 63 (H) 0 - 53 U/L   Total Protein 6.8 6.0 - 8.3 g/dL   Albumin 3.9 3.5 - 5.2 g/dL   GFR 91.82 >60.00 mL/min   Calcium 8.7 8.4 - 10.5 mg/dL  Hemoglobin A1c  Result Value Ref Range   Hgb A1c MFr Bld 7.9 (H) 4.6 - 6.5 %  Lipid panel  Result Value Ref Range   Cholesterol 149 0 - 200 mg/dL   Triglycerides 164.0 (H) 0.0 - 149.0 mg/dL   HDL 41.50 >39.00 mg/dL   VLDL 32.8 0.0 - 40.0 mg/dL   LDL Cholesterol 75 0 - 99 mg/dL   Total CHOL/HDL Ratio 4    NonHDL 107.56   Microalbumin / creatinine urine ratio  Result Value Ref Range   Microalb, Ur 2.9 (H) 0.0 - 1.9 mg/dL   Creatinine,U 231.6 mg/dL   Microalb Creat Ratio 1.3 0.0 - 30.0 mg/g  PSA  Result Value Ref Range   PSA 0.60 0.10 - 4.00 ng/mL  TSH  Result Value Ref Range   TSH 3.27 0.35 - 5.50 uIU/mL  Ferritin  Result Value Ref  Range   Ferritin 237.8 22.0 - 322.0 ng/mL  Folate  Result Value Ref Range   Folate >23.8 >5.9 ng/mL  B12  Result Value Ref Range   Vitamin B-12 >1500 (H) 211 - 911 pg/mL

## 2022-10-12 NOTE — Patient Instructions (Addendum)
Good to see you again today- I will be in touch with our labs Recommend the latest covid, dose of RSV if not done already  Please give your GI doctor call and schedule colonoscopy  We will get you started on Trulicity, let me know if your insurance prefers a different drug and I can substitute.  Start at 0.75 once a week.  We can go up to the next higher dose monthly as needed and tolerated.  If you do not feel comfortable starting the shot at home please contact us and we will set up a nurse visit for teaching session  Assuming all is well, please see me in about 6 months

## 2022-10-17 DIAGNOSIS — Z9989 Dependence on other enabling machines and devices: Secondary | ICD-10-CM | POA: Diagnosis not present

## 2022-10-17 DIAGNOSIS — G4733 Obstructive sleep apnea (adult) (pediatric): Secondary | ICD-10-CM | POA: Diagnosis not present

## 2022-10-18 ENCOUNTER — Encounter: Payer: Self-pay | Admitting: Family Medicine

## 2022-10-18 ENCOUNTER — Ambulatory Visit (HOSPITAL_BASED_OUTPATIENT_CLINIC_OR_DEPARTMENT_OTHER)
Admission: RE | Admit: 2022-10-18 | Discharge: 2022-10-18 | Disposition: A | Discharge status: Home / Self Care | Payer: BC Managed Care – PPO | Source: Ambulatory Visit | Attending: Family Medicine | Admitting: Family Medicine

## 2022-10-18 ENCOUNTER — Ambulatory Visit (INDEPENDENT_AMBULATORY_CARE_PROVIDER_SITE_OTHER): Payer: BC Managed Care – PPO | Admitting: Family Medicine

## 2022-10-18 ENCOUNTER — Other Ambulatory Visit: Payer: Self-pay | Admitting: Family Medicine

## 2022-10-18 VITALS — BP 130/78 | HR 64 | Temp 97.7°F | Resp 18 | Ht 71.0 in | Wt 318.0 lb

## 2022-10-18 DIAGNOSIS — Z130 Encounter for screening for diseases of the blood and blood-forming organs and certain disorders involving the immune mechanism: Secondary | ICD-10-CM

## 2022-10-18 DIAGNOSIS — Z125 Encounter for screening for malignant neoplasm of prostate: Secondary | ICD-10-CM | POA: Diagnosis not present

## 2022-10-18 DIAGNOSIS — E119 Type 2 diabetes mellitus without complications: Secondary | ICD-10-CM | POA: Diagnosis not present

## 2022-10-18 DIAGNOSIS — Z87891 Personal history of nicotine dependence: Secondary | ICD-10-CM | POA: Diagnosis not present

## 2022-10-18 DIAGNOSIS — Z0000 Encounter for general adult medical examination without abnormal findings: Secondary | ICD-10-CM

## 2022-10-18 DIAGNOSIS — Z13220 Encounter for screening for lipoid disorders: Secondary | ICD-10-CM

## 2022-10-18 DIAGNOSIS — I252 Old myocardial infarction: Secondary | ICD-10-CM

## 2022-10-18 DIAGNOSIS — Z9884 Bariatric surgery status: Secondary | ICD-10-CM

## 2022-10-18 DIAGNOSIS — E890 Postprocedural hypothyroidism: Secondary | ICD-10-CM

## 2022-10-18 DIAGNOSIS — Z136 Encounter for screening for cardiovascular disorders: Secondary | ICD-10-CM | POA: Diagnosis not present

## 2022-10-18 DIAGNOSIS — E669 Obesity, unspecified: Secondary | ICD-10-CM | POA: Diagnosis not present

## 2022-10-18 LAB — CBC
HCT: 40.3 % (ref 39.0–52.0)
Hemoglobin: 13.9 g/dL (ref 13.0–17.0)
MCHC: 34.4 g/dL (ref 30.0–36.0)
MCV: 88 fl (ref 78.0–100.0)
Platelets: 131 10*3/uL — ABNORMAL LOW (ref 150.0–400.0)
RBC: 4.59 Mil/uL (ref 4.22–5.81)
RDW: 13.3 % (ref 11.5–15.5)
WBC: 6.3 10*3/uL (ref 4.0–10.5)

## 2022-10-18 LAB — COMPREHENSIVE METABOLIC PANEL
ALT: 63 U/L — ABNORMAL HIGH (ref 0–53)
AST: 28 U/L (ref 0–37)
Albumin: 3.9 g/dL (ref 3.5–5.2)
Alkaline Phosphatase: 136 U/L — ABNORMAL HIGH (ref 39–117)
BUN: 12 mg/dL (ref 6–23)
CO2: 30 mEq/L (ref 19–32)
Calcium: 8.7 mg/dL (ref 8.4–10.5)
Chloride: 100 mEq/L (ref 96–112)
Creatinine, Ser: 0.83 mg/dL (ref 0.40–1.50)
GFR: 91.82 mL/min (ref >60.00)
Glucose, Bld: 152 mg/dL — ABNORMAL HIGH (ref 70–99)
Potassium: 3.8 mEq/L (ref 3.5–5.1)
Sodium: 136 mEq/L (ref 135–145)
Total Bilirubin: 0.5 mg/dL (ref 0.2–1.2)
Total Protein: 6.8 g/dL (ref 6.0–8.3)

## 2022-10-18 LAB — LIPID PANEL
Cholesterol: 149 mg/dL (ref 0–200)
HDL: 41.5 mg/dL (ref >39.00)
LDL Cholesterol: 75 mg/dL (ref 0–99)
NonHDL: 107.56
Total CHOL/HDL Ratio: 4
Triglycerides: 164 mg/dL — ABNORMAL HIGH (ref 0.0–149.0)
VLDL: 32.8 mg/dL (ref 0.0–40.0)

## 2022-10-18 LAB — FOLATE: Folate: >23.8 ng/mL (ref >5.9)

## 2022-10-18 LAB — MICROALBUMIN / CREATININE URINE RATIO
Creatinine,U: 231.6 mg/dL
Microalb Creat Ratio: 1.3 mg/g (ref 0.0–30.0)
Microalb, Ur: 2.9 mg/dL — ABNORMAL HIGH (ref 0.0–1.9)

## 2022-10-18 LAB — HEMOGLOBIN A1C: Hgb A1c MFr Bld: 7.9 % — ABNORMAL HIGH (ref 4.6–6.5)

## 2022-10-18 LAB — VITAMIN B12: Vitamin B-12: >1500 pg/mL — ABNORMAL HIGH (ref 211–911)

## 2022-10-18 LAB — PSA: PSA: 0.6 ng/mL (ref 0.10–4.00)

## 2022-10-18 LAB — TSH: TSH: 3.27 u[IU]/mL (ref 0.35–5.50)

## 2022-10-18 LAB — FERRITIN: Ferritin: 237.8 ng/mL (ref 22.0–322.0)

## 2022-10-18 MED ORDER — LEVOTHYROXINE SODIUM 150 MCG PO TABS: ORAL_TABLET | ORAL | 3 refills | Status: DC

## 2022-10-18 MED ORDER — TRULICITY 0.75 MG/0.5ML ~~LOC~~ SOAJ: 0.7500 mg | SUBCUTANEOUS | 3 refills | Status: DC

## 2022-10-29 ENCOUNTER — Encounter: Payer: Self-pay | Admitting: Family Medicine

## 2022-11-03 DIAGNOSIS — G4733 Obstructive sleep apnea (adult) (pediatric): Secondary | ICD-10-CM | POA: Diagnosis not present

## 2022-11-04 ENCOUNTER — Telehealth: Payer: Self-pay

## 2022-11-04 NOTE — Telephone Encounter (Signed)
Called [facility] at [phone]- spoke w/ Annabelle Harman- she will begin PA w/ clinical team- they will be sending any needed information to [place] via fax.

## 2022-11-04 NOTE — Telephone Encounter (Signed)
Called [facility] at [phone]- spoke w/ Annabelle Harman- she will begin PA w/ clinical team- they will be sending any needed information to [place] via fax

## 2022-11-05 NOTE — Telephone Encounter (Signed)
Form received, completed and faxed back to [facility] at [phone]. Awaiting determination.

## 2022-11-10 ENCOUNTER — Encounter: Payer: Self-pay | Admitting: Family Medicine

## 2022-11-10 NOTE — Telephone Encounter (Signed)
PA denied. Plan requires Pt to try and fail an SGLT2 inhibitor with cardiovascular benefit (example: Jardiance).

## 2022-11-12 ENCOUNTER — Encounter: Payer: Self-pay | Admitting: Family Medicine

## 2022-11-16 DIAGNOSIS — G4733 Obstructive sleep apnea (adult) (pediatric): Secondary | ICD-10-CM | POA: Diagnosis not present

## 2022-11-16 DIAGNOSIS — Z9989 Dependence on other enabling machines and devices: Secondary | ICD-10-CM | POA: Diagnosis not present

## 2022-12-04 DIAGNOSIS — G4733 Obstructive sleep apnea (adult) (pediatric): Secondary | ICD-10-CM | POA: Diagnosis not present

## 2022-12-17 ENCOUNTER — Encounter (HOSPITAL_COMMUNITY): Payer: Self-pay | Admitting: *Deleted

## 2023-01-04 DIAGNOSIS — G4733 Obstructive sleep apnea (adult) (pediatric): Secondary | ICD-10-CM | POA: Diagnosis not present

## 2023-02-24 ENCOUNTER — Encounter (INDEPENDENT_AMBULATORY_CARE_PROVIDER_SITE_OTHER): Payer: BC Managed Care – PPO | Admitting: Family Medicine

## 2023-02-24 DIAGNOSIS — R051 Acute cough: Secondary | ICD-10-CM | POA: Diagnosis not present

## 2023-02-25 MED ORDER — DOXYCYCLINE HYCLATE 100 MG PO TABS: 100.0000 mg | ORAL_TABLET | Freq: Two times a day (BID) | ORAL | 0 refills | Status: DC

## 2023-02-25 NOTE — Telephone Encounter (Signed)
Please see the MyChart message reply(ies) for my assessment and plan.  The patient gave consent for this Medical Advice Message and is aware that it may result in a bill to their insurance company as well as the possibility that this may result in a co-payment or deductible. They are an established patient, but are not seeking medical advice exclusively about a problem treated during an in person or video visit in the last 7 days. I did not recommend an in person or video visit within 7 days of my reply.  I spent a total of 12 minutes cumulative time within 7 days through MyChart messaging Reginaldo Hazard, MD  

## 2023-02-25 NOTE — Addendum Note (Signed)
Addended by: Abbe Amsterdam C on: 02/25/2023 11:48 AM   Modules accepted: Orders

## 2023-04-18 NOTE — Patient Instructions (Incomplete)
It was great to see you again today, I will be in touch with your labs as soon as possible. Assuming all is well please see me in about 6 months  Please do set up your colon cancer screening and eye exam I ordered lung cancer screening CT scan for you today- please set this up at your convenience Continue to work on exercise and weight loss For diabetes- let's have you start on metformin 500 mg once daily (with dinner probably the best time)- most common side effect is gassiness and diarrhea. Assuming you tolerate ok increase to twice a day after 2-3 weeks We can also try to add Mounjaro for blood sugar and weight control- start at 2.5 mg weekly and go up every month or so as needed and tolerated.  Please let me know how it goes getting this filled and paid for. Check out their website for a savings card!

## 2023-04-18 NOTE — Progress Notes (Unsigned)
[facility] [address], [address] [place], [place] [address] [phone]  Date:  04/20/2023   Name:  Scott Bauer   DOB:  01-Oct-1956   MRN:  [id number]  PCP:  Pearline Cables, MD    Chief Complaint: No chief complaint on file.   History of Present Illness:  Scott Bauer is a 67 y.o. very pleasant male patient who presents with the following:  Patient seen today for periodic follow-up Most recent visit with myself was in November for his physical-he did contact me last month with concern for cough  History of MI, HTN, OSA, hyperlipidemia, DM, hypothymism, idiopathic thrombocytopenia S/p PCI in 2008 and 2010 Gastric sleeve about 5 years ago- he lost quite a bit of weight but has gained a lot of it back.  Max weight about 350  He is followed by pulmonology for sleep apnea, most recent visit last August Cardiology, Dr. Myrtie Neither in November  Colon cancer screening Eye exam Need to update A1c, other labs-complete panel done in November, ALT elevated at 63 and mild elevation alk phos  Aspirin 81 Atorvastatin 40 Carvedilol 6.25 twice daily Trulicity 0.75 Levothyroxine 150 Ramipril 2.5 daily  Patient Active Problem List   Diagnosis Date Noted   Colon polyp 10/08/2021   Fatty liver 10/08/2021   Hypothyroidism 10/08/2021   Allergic rhinitis 10/08/2021   Idiopathic thrombocytopenia (HCC) 01/03/2019   History of MI (myocardial infarction) 01/03/2019   Erectile dysfunction 04/09/2018   History of right coronary artery stent placement 07/01/2017   H/O bariatric surgery 06/08/2017   Obesity, Class III, BMI 40-49.9 (morbid obesity) (HCC) 05/30/2017   Diabetes mellitus type 2 in obese 05/30/2017   Essential hypertension 05/30/2017   OSA on CPAP 05/30/2017   Hypercholesterolemia 05/30/2017   Family history of abdominal aortic aneurysm (AAA) 12/16/2015   Testicular hypofunction 12/16/2015   Thrombocytopenia,  unspecified (HCC) 12/16/2015   Coronary artery disease involving native coronary artery of native heart without angina pectoris 05/22/2015   Old MI (myocardial infarction) 05/22/2015    Past Medical History:  Diagnosis Date   Acquired hypothyroidism 12/16/2015   Allergic rhinitis 10/08/2021   Allergy    Anal fissure    Chronic allergic conjunctivitis 10/08/2021   Colon polyp    Coronary artery disease involving native coronary artery of native heart without angina pectoris 05/22/2015   Diabetes mellitus type 2 in obese (HCC) 05/30/2017   Diabetes mellitus without complication (HCC)    type 2  on no meds   Diet-controlled diabetes mellitus (HCC) 01/03/2019   Erectile dysfunction 04/09/2018   Essential hypertension 05/30/2017   Family history of abdominal aortic aneurysm (AAA) 12/16/2015   Fatty liver    H/O bariatric surgery 06/08/2017   Heart disease    History of MI (myocardial infarction) 01/03/2019   History of right coronary artery stent placement 07/01/2017   Hypercholesterolemia 05/30/2017   Hyperlipidemia    Hypertension    Hypothyroidism    Idiopathic thrombocytopenia (HCC) 01/03/2019   Morbid obesity (HCC) 05/30/2017   Obesity, Class III, BMI 40-49.9 (morbid obesity) (HCC) 05/30/2017   Old MI (myocardial infarction) 05/22/2015   OSA on CPAP 05/30/2017   Pneumonia 2000   Postablative hypothyroidism 01/03/2019   Rhinitis, chronic 07/11/2017   Last Assessment & Plan:  Concern over profuse rhinorrhea. He has intermittent times where his nose will run constantly.  Typically starts with a sneeze.  And then it goes away spontaneously.  He uses Flonase on a regular basis.  Unfortunately, this does not control his symptoms and he presents to discuss other options.  He feels his rhinorrhea is bilateral. EXAMINATION reveals no intranasal polyps   Testicular hypofunction 12/16/2015   Thrombocytopenia, unspecified (HCC) 12/16/2015    Past Surgical History:  Procedure Laterality Date   ANAL FISSURE  REPAIR     CARDIAC CATHETERIZATION     COLOSTOMY  2015   LAPAROSCOPIC GASTRIC SLEEVE RESECTION N/A 05/30/2017   Procedure: LAPAROSCOPIC GASTRIC SLEEVE RESECTION, UPPER ENDO;  Surgeon: Gaynelle Adu, MD;  Location: [facility];  Service: General;  Laterality: N/A;   NASAL SINUS SURGERY      Social History   Tobacco Use   Smoking status: Some Days    Types: Cigarettes, Cigars    Last attempt to quit: 2008    Years since quitting: 16.4   Smokeless tobacco: Never   Tobacco comments:    occasionally   Vaping Use   Vaping Use: Never used  Substance Use Topics   Alcohol use: Yes    Alcohol/week: 1.0 standard drink of alcohol    Types: 1 Cans of beer per week    Comment: beer occasionally , sometimes a mixed drink    Drug use: No    Comment: occasionally "marijuana gummie" out of town    Family History  Problem Relation Age of Onset   Cancer Other    Hypertension Other    Diabetes Other    Depression Mother    Diabetes Mother    Kidney disease Mother    Miscarriages / [place] Mother    Alcohol abuse Father    Arthritis Father    Cancer Father    Heart attack Father    Heart disease Sister    Hyperlipidemia Sister    Hypertension Sister    Depression Sister    Drug abuse Sister    Early death Sister    Arthritis Brother    Heart attack Brother    Heart attack Maternal Grandmother    Arthritis Maternal Grandfather    Colon cancer Neg Hx    Colon polyps Neg Hx    Esophageal cancer Neg Hx    Stomach cancer Neg Hx    Rectal cancer Neg Hx     Allergies  Allergen Reactions   Cortisone    Prednisone Nausea Only and Other (See Comments)    Makes him feel funny  Only has issues when takes dose packs    Medication list has been reviewed and updated.  Current Outpatient Medications on File Prior to Visit  Medication Sig Dispense Refill   aspirin EC 81 MG tablet Take 81 mg by mouth daily.     atorvastatin (LIPITOR) 40 MG tablet TAKE 1 TABLET(40 MG) BY MOUTH DAILY 90  tablet 3   carvedilol (COREG) 6.25 MG tablet Take 6.25 mg by mouth 2 (two) times daily with a meal.     cetirizine (ZYRTEC) 10 MG tablet Take 10 mg by mouth daily.     Cholecalciferol (VITAMIN D-3) 5000 units TABS Take 5,000 Units by mouth every evening.     Coenzyme Q10 (COQ10) 100 MG CAPS Take 100 mg by mouth daily.     doxycycline (VIBRA-TABS) 100 MG tablet Take 1 tablet (100 mg total) by mouth 2 (two) times daily. 20 tablet 0   Dulaglutide (TRULICITY) 0.75 MG/0.5ML SOPN Inject 0.75 mg into the skin once a week. 2 mL 3   levothyroxine (SYNTHROID) 150 MCG tablet TAKE  1 TABLET(150 MCG) BY MOUTH DAILY BEFORE BREAKFAST 90 tablet 3   nitroGLYCERIN (NITROSTAT) 0.4 MG SL tablet Place 0.4 mg under the tongue every 5 (five) minutes as needed for chest pain.     ramipril (ALTACE) 2.5 MG capsule TAKE 1 CAPSULE(2.5 MG) BY MOUTH DAILY 90 capsule 3   UNABLE TO FIND Take 500 mg by mouth daily.  Mega Red 500 mg      UNABLE TO FIND Take 500 mg by mouth 3 (three) times daily. Med Name: Calcium Citrate     UNABLE TO FIND Take 1 tablet by mouth daily.  Bariatric Advantage      vitamin E 400 UNIT capsule Take 400 Units by mouth daily.     No current facility-administered medications on file prior to visit.    Review of Systems:  As per HPI- otherwise negative.   Physical Examination: There were no vitals filed for this visit. There were no vitals filed for this visit. There is no height or weight on file to calculate BMI. Ideal Body Weight:    GEN: no acute distress. HEENT: Atraumatic, Normocephalic.  Ears and Nose: No external deformity. CV: RRR, No M/G/R. No JVD. No thrill. No extra heart sounds. PULM: CTA B, no wheezes, crackles, rhonchi. No retractions. No resp. distress. No accessory muscle use. ABD: S, NT, ND, +BS. No rebound. No HSM. EXTR: No c/c/e PSYCH: Normally interactive. Conversant.    Assessment and Plan: ***  Following up today, will be in touch with labs soon as  possible Signed Abbe Amsterdam, MD

## 2023-04-20 ENCOUNTER — Encounter: Payer: Self-pay | Admitting: Family Medicine

## 2023-04-20 ENCOUNTER — Ambulatory Visit (INDEPENDENT_AMBULATORY_CARE_PROVIDER_SITE_OTHER): Payer: BC Managed Care – PPO | Admitting: Family Medicine

## 2023-04-20 ENCOUNTER — Telehealth: Payer: Self-pay

## 2023-04-20 VITALS — BP 114/60 | HR 75 | Temp 98.6°F | Resp 18 | Ht 71.0 in | Wt 301.8 lb

## 2023-04-20 DIAGNOSIS — Z122 Encounter for screening for malignant neoplasm of respiratory organs: Secondary | ICD-10-CM

## 2023-04-20 DIAGNOSIS — E890 Postprocedural hypothyroidism: Secondary | ICD-10-CM

## 2023-04-20 DIAGNOSIS — Z9884 Bariatric surgery status: Secondary | ICD-10-CM

## 2023-04-20 DIAGNOSIS — E1169 Type 2 diabetes mellitus with other specified complication: Secondary | ICD-10-CM

## 2023-04-20 DIAGNOSIS — Z7985 Long-term (current) use of injectable non-insulin antidiabetic drugs: Secondary | ICD-10-CM

## 2023-04-20 DIAGNOSIS — I2510 Atherosclerotic heart disease of native coronary artery without angina pectoris: Secondary | ICD-10-CM

## 2023-04-20 DIAGNOSIS — D693 Immune thrombocytopenic purpura: Secondary | ICD-10-CM | POA: Diagnosis not present

## 2023-04-20 DIAGNOSIS — R748 Abnormal levels of other serum enzymes: Secondary | ICD-10-CM

## 2023-04-20 DIAGNOSIS — E669 Obesity, unspecified: Secondary | ICD-10-CM

## 2023-04-20 DIAGNOSIS — I252 Old myocardial infarction: Secondary | ICD-10-CM

## 2023-04-20 DIAGNOSIS — Z13220 Encounter for screening for lipoid disorders: Secondary | ICD-10-CM

## 2023-04-20 LAB — COMPREHENSIVE METABOLIC PANEL
ALT: 49 U/L (ref 0–53)
AST: 26 U/L (ref 0–37)
Albumin: 3.9 g/dL (ref 3.5–5.2)
Alkaline Phosphatase: 156 U/L — ABNORMAL HIGH (ref 39–117)
BUN: 11 mg/dL (ref 6–23)
CO2: 31 mEq/L (ref 19–32)
Calcium: 9 mg/dL (ref 8.4–10.5)
Chloride: 98 mEq/L (ref 96–112)
Creatinine, Ser: 0.9 mg/dL (ref 0.40–1.50)
GFR: 89.29 mL/min (ref >60.00)
Glucose, Bld: 318 mg/dL — ABNORMAL HIGH (ref 70–99)
Potassium: 4.3 mEq/L (ref 3.5–5.1)
Sodium: 136 mEq/L (ref 135–145)
Total Bilirubin: 0.8 mg/dL (ref 0.2–1.2)
Total Protein: 6.8 g/dL (ref 6.0–8.3)

## 2023-04-20 LAB — CBC
HCT: 43.1 % (ref 39.0–52.0)
Hemoglobin: 14.6 g/dL (ref 13.0–17.0)
MCHC: 34 g/dL (ref 30.0–36.0)
MCV: 87 fl (ref 78.0–100.0)
Platelets: 114 10*3/uL — ABNORMAL LOW (ref 150.0–400.0)
RBC: 4.95 Mil/uL (ref 4.22–5.81)
RDW: 13.2 % (ref 11.5–15.5)
WBC: 5.2 10*3/uL (ref 4.0–10.5)

## 2023-04-20 LAB — TSH: TSH: 0.21 u[IU]/mL — ABNORMAL LOW (ref 0.35–5.50)

## 2023-04-20 LAB — HEMOGLOBIN A1C: Hgb A1c MFr Bld: 10.7 % — ABNORMAL HIGH (ref 4.6–6.5)

## 2023-04-20 MED ORDER — ATORVASTATIN CALCIUM 40 MG PO TABS: ORAL_TABLET | ORAL | 3 refills | Status: DC

## 2023-04-20 MED ORDER — TIRZEPATIDE 2.5 MG/0.5ML ~~LOC~~ SOAJ: 2.5000 mg | SUBCUTANEOUS | 1 refills | Status: DC

## 2023-04-20 MED ORDER — RAMIPRIL 2.5 MG PO CAPS: ORAL_CAPSULE | ORAL | 3 refills | Status: DC

## 2023-04-20 MED ORDER — METFORMIN HCL 500 MG PO TABS
500.0000 mg | ORAL_TABLET | Freq: Two times a day (BID) | ORAL | 3 refills | Status: DC
Start: 2023-04-20 — End: 2024-02-13

## 2023-04-20 NOTE — Telephone Encounter (Signed)
Called [facility] at [phone] to initiate PA, they will fax form to [place].

## 2023-04-21 NOTE — Telephone Encounter (Signed)
Form completed and faxed w/ last OV note and A1c to [facility] at [phone]. Awaiting determination.

## 2023-04-21 NOTE — Telephone Encounter (Signed)
Form received

## 2023-04-26 ENCOUNTER — Encounter: Payer: Self-pay | Admitting: Family Medicine

## 2023-04-26 NOTE — Telephone Encounter (Signed)
PA denied. Pt must first try and fail SGL-2.

## 2023-04-26 NOTE — Telephone Encounter (Signed)
Message sent to patient via MyChart. 

## 2023-05-02 ENCOUNTER — Ambulatory Visit (HOSPITAL_BASED_OUTPATIENT_CLINIC_OR_DEPARTMENT_OTHER): Payer: BC Managed Care – PPO

## 2023-05-02 ENCOUNTER — Encounter (INDEPENDENT_AMBULATORY_CARE_PROVIDER_SITE_OTHER): Payer: BC Managed Care – PPO | Admitting: Family Medicine

## 2023-05-02 DIAGNOSIS — G4733 Obstructive sleep apnea (adult) (pediatric): Secondary | ICD-10-CM | POA: Diagnosis not present

## 2023-05-02 DIAGNOSIS — U071 COVID-19: Secondary | ICD-10-CM | POA: Diagnosis not present

## 2023-05-02 MED ORDER — NIRMATRELVIR/RITONAVIR (PAXLOVID)TABLET: 3.0000 | ORAL_TABLET | Freq: Two times a day (BID) | ORAL | 0 refills | Status: AC

## 2023-05-02 NOTE — Telephone Encounter (Signed)
Please see the MyChart message reply(ies) for my assessment and plan.  The patient gave consent for this Medical Advice Message and is aware that it may result in a bill to their insurance company as well as the possibility that this may result in a co-payment or deductible. They are an established patient, but are not seeking medical advice exclusively about a problem treated during an in person or video visit in the last 7 days. I did not recommend an in person or video visit within 7 days of my reply.  I spent a total of 10 minutes cumulative time within 7 days through MyChart messaging Govanni Plemons, MD  

## 2023-05-02 NOTE — Addendum Note (Signed)
Addended by: Abbe Amsterdam C on: 05/02/2023 11:23 AM   Modules accepted: Orders

## 2023-05-07 ENCOUNTER — Ambulatory Visit (HOSPITAL_BASED_OUTPATIENT_CLINIC_OR_DEPARTMENT_OTHER): Payer: BC Managed Care – PPO

## 2023-05-13 ENCOUNTER — Ambulatory Visit (HOSPITAL_BASED_OUTPATIENT_CLINIC_OR_DEPARTMENT_OTHER)
Admission: RE | Admit: 2023-05-13 | Discharge: 2023-05-13 | Disposition: A | Discharge status: Home / Self Care | Payer: BC Managed Care – PPO | Source: Ambulatory Visit | Attending: Family Medicine | Admitting: Family Medicine

## 2023-05-13 DIAGNOSIS — Z122 Encounter for screening for malignant neoplasm of respiratory organs: Secondary | ICD-10-CM | POA: Diagnosis not present

## 2023-05-13 DIAGNOSIS — Z87891 Personal history of nicotine dependence: Secondary | ICD-10-CM | POA: Diagnosis not present

## 2023-05-17 ENCOUNTER — Encounter: Payer: Self-pay | Admitting: Family Medicine

## 2023-05-18 ENCOUNTER — Encounter: Payer: Self-pay | Admitting: Family Medicine

## 2023-05-18 ENCOUNTER — Other Ambulatory Visit: Payer: Self-pay | Admitting: Family Medicine

## 2023-05-18 DIAGNOSIS — I3481 Nonrheumatic mitral (valve) annulus calcification: Secondary | ICD-10-CM

## 2023-05-18 NOTE — Progress Notes (Signed)
Cardiologist Dr Tomie China requested that I order an echo- will update pt

## 2023-05-30 DIAGNOSIS — M9902 Segmental and somatic dysfunction of thoracic region: Secondary | ICD-10-CM | POA: Diagnosis not present

## 2023-05-30 DIAGNOSIS — M9903 Segmental and somatic dysfunction of lumbar region: Secondary | ICD-10-CM | POA: Diagnosis not present

## 2023-05-30 DIAGNOSIS — M545 Low back pain, unspecified: Secondary | ICD-10-CM | POA: Diagnosis not present

## 2023-05-30 DIAGNOSIS — M546 Pain in thoracic spine: Secondary | ICD-10-CM | POA: Diagnosis not present

## 2023-06-01 DIAGNOSIS — G4733 Obstructive sleep apnea (adult) (pediatric): Secondary | ICD-10-CM | POA: Diagnosis not present

## 2023-06-15 ENCOUNTER — Ambulatory Visit (HOSPITAL_COMMUNITY): Payer: BC Managed Care – PPO | Attending: Family Medicine

## 2023-06-15 ENCOUNTER — Encounter: Payer: Self-pay | Admitting: Family Medicine

## 2023-06-15 DIAGNOSIS — I3481 Nonrheumatic mitral (valve) annulus calcification: Secondary | ICD-10-CM | POA: Insufficient documentation

## 2023-06-15 LAB — ECHOCARDIOGRAM COMPLETE
Area-P 1/2: 2.67 cm2
MV VTI: 2.75 cm2
S' Lateral: 3.1 cm

## 2023-06-22 ENCOUNTER — Encounter: Payer: Self-pay | Admitting: Family Medicine

## 2023-06-22 DIAGNOSIS — E669 Obesity, unspecified: Secondary | ICD-10-CM

## 2023-06-22 MED ORDER — TIRZEPATIDE 5 MG/0.5ML ~~LOC~~ SOAJ: 5.0000 mg | SUBCUTANEOUS | 2 refills | Status: DC

## 2023-06-27 NOTE — Telephone Encounter (Signed)
I called the pharmacy to see what was needed. And a PA and ICD code are needed for the Rx. However, we tried a PA just about 8 weeks ago and it was denied since insurance required that he take a GLP-1.  He then mentioned that he was using a discount card for the Rx.

## 2023-06-28 MED ORDER — TIRZEPATIDE 5 MG/0.5ML ~~LOC~~ SOAJ
5.0000 mg | SUBCUTANEOUS | 2 refills | Status: DC
Start: 2023-06-28 — End: 2023-11-09

## 2023-06-28 NOTE — Addendum Note (Signed)
Addended by: Abbe Amsterdam C on: 06/28/2023 11:45 AM   Modules accepted: Orders

## 2023-06-28 NOTE — Addendum Note (Signed)
Addended by: Abbe Amsterdam C on: 06/28/2023 04:47 PM   Modules accepted: Orders

## 2023-06-29 DIAGNOSIS — M545 Low back pain, unspecified: Secondary | ICD-10-CM | POA: Diagnosis not present

## 2023-06-29 DIAGNOSIS — M9902 Segmental and somatic dysfunction of thoracic region: Secondary | ICD-10-CM | POA: Diagnosis not present

## 2023-06-29 DIAGNOSIS — M546 Pain in thoracic spine: Secondary | ICD-10-CM | POA: Diagnosis not present

## 2023-06-29 DIAGNOSIS — M9903 Segmental and somatic dysfunction of lumbar region: Secondary | ICD-10-CM | POA: Diagnosis not present

## 2023-07-01 ENCOUNTER — Other Ambulatory Visit: Payer: Self-pay

## 2023-07-01 ENCOUNTER — Encounter: Payer: Self-pay | Admitting: Cardiology

## 2023-07-01 ENCOUNTER — Ambulatory Visit: Payer: BC Managed Care – PPO | Admitting: Cardiology

## 2023-07-01 VITALS — BP 120/70 | HR 70 | Ht 71.0 in | Wt 298.1 lb

## 2023-07-01 DIAGNOSIS — E1169 Type 2 diabetes mellitus with other specified complication: Secondary | ICD-10-CM

## 2023-07-01 DIAGNOSIS — I10 Essential (primary) hypertension: Secondary | ICD-10-CM

## 2023-07-01 DIAGNOSIS — G4733 Obstructive sleep apnea (adult) (pediatric): Secondary | ICD-10-CM | POA: Diagnosis not present

## 2023-07-01 DIAGNOSIS — E669 Obesity, unspecified: Secondary | ICD-10-CM

## 2023-07-01 DIAGNOSIS — Z7984 Long term (current) use of oral hypoglycemic drugs: Secondary | ICD-10-CM

## 2023-07-01 DIAGNOSIS — E66813 Obesity, class 3: Secondary | ICD-10-CM

## 2023-07-01 DIAGNOSIS — I2510 Atherosclerotic heart disease of native coronary artery without angina pectoris: Secondary | ICD-10-CM | POA: Diagnosis not present

## 2023-07-01 MED ORDER — NITROGLYCERIN 0.4 MG SL SUBL: 0.4000 mg | SUBLINGUAL_TABLET | SUBLINGUAL | 6 refills | Status: DC | PRN

## 2023-07-01 NOTE — Patient Instructions (Addendum)
Medication Instructions:  Your physician recommends that you continue on your current medications as directed. Please refer to the Current Medication list given to you today.  *If you need a refill on your cardiac medications before your next appointment, please call your pharmacy*   Lab Work: Your physician recommends that you have labs done in the office today. Your test included complete metabolic panel, TSH, liver function and lipids.  If you have labs (blood work) drawn today and your tests are completely normal, you will receive your results only by: MyChart Message (if you have MyChart) OR A paper copy in the mail If you have any lab test that is abnormal or we need to change your treatment, we will call you to review the results.   Testing/Procedures: None ordered   Follow-Up: At [facility], you and your health needs are our priority.  As part of our continuing mission to provide you with exceptional heart care, we have created designated Provider Care Teams.  These Care Teams include your primary Cardiologist (physician) and Advanced Practice Providers (APPs -  Physician Assistants and Nurse Practitioners) who all work together to provide you with the care you need, when you need it.  We recommend signing up for the patient portal called "MyChart".  Sign up information is provided on this After Visit Summary.  MyChart is used to connect with patients for Virtual Visits (Telemedicine).  Patients are able to view lab/test results, encounter notes, upcoming appointments, etc.  Non-urgent messages can be sent to your provider as well.   To learn more about what you can do with MyChart, go to [email or web address].    Your next appointment:   9 month(s)  The format for your next appointment:   In Person  Provider:   Belva Crome, MD    Other Instructions none  Important Information About Sugar

## 2023-07-01 NOTE — Progress Notes (Signed)
Cardiology Office Note:    Date:  07/01/2023   ID:  Scott Bauer, DOB Jul 20, 1956, MRN [id number]  PCP:  Pearline Cables, MD  Cardiologist:  Garwin Brothers, MD   Referring MD: Pearline Cables, MD    ASSESSMENT:    1. Essential hypertension   2. Coronary artery disease involving native coronary artery of native heart without angina pectoris   3. OSA on CPAP   4. Type 2 diabetes mellitus with obesity (HCC)   5. Obesity, Class III, BMI 40-49.9 (morbid obesity) (HCC)    PLAN:    In order of problems listed above:  Coronary artery disease: Secondary prevention stressed with the patient.  Importance of compliance with diet medication stressed and vocalized understanding.  He was advised to walk at least half an hour a day 5 days a week and he promises to do so. Uncontrolled diabetes mellitus: Hemoglobin A1c greater than 10.  I questioned him about this.  Diet exercise stressed.  I told him to consider being evaluated by endocrinologist and keep his primary care provider in the loop.  Compliance urged. Essential hypertension: Blood pressure stable and diet was emphasized. Mixed dyslipidemia: On lipid-lowering medications.  Lipids not at goal.  Will do blood work today.  I will act accordingly based on the numbers. Obesity and sleep apnea: Compliance/sleep health issues were discussed.  Discussed risks of obesity explained. Because of thyroid abnormality disease and the last TSH being abnormal.  Will check his TSH and send to primary care for review. Patient will be seen in follow-up appointment in 9 months or earlier if the patient has any concerns.    Medication Adjustments/Labs and Tests Ordered: Current medicines are reviewed at length with the patient today.  Concerns regarding medicines are outlined above.  Orders Placed This Encounter  Procedures   EKG 12-Lead   No orders of the defined types were placed in this encounter.    No chief complaint on file.     History of Present Illness:    [name] is a 67 y.o. male.  Patient has past medical history of coronary artery disease, essential hypertension, mixed dyslipidemia and uncontrolled diabetes mellitus.  He has sleep apnea.  He also has history of thyroid abnormalities.  He denies any chest pain orthopnea or PND.  He leads a sedentary lifestyle.  His hemoglobin A1c was greater than 10.  At the time of my evaluation, the patient is alert awake oriented and in no distress.  Past Medical History:  Diagnosis Date   Allergic rhinitis 10/08/2021   Colon polyp    Coronary arteriosclerosis in native artery 05/22/2015   Coronary artery disease involving native coronary artery of native heart without angina pectoris 05/22/2015   Diabetes mellitus without complication (HCC) 10/08/2021   type 2  on no meds     Erectile dysfunction 04/09/2018   Essential hypertension 05/30/2017   Family history of abdominal aortic aneurysm (AAA) 12/16/2015   Fatty liver    H/O bariatric surgery 06/08/2017   History of MI (myocardial infarction) 01/03/2019   History of right coronary artery stent placement 07/01/2017   Hypercholesterolemia 05/30/2017   Hypothyroidism    Idiopathic thrombocytopenia (HCC) 01/03/2019   Multiple-type hyperlipidemia 05/22/2015   Obesity, Class III, BMI 40-49.9 (morbid obesity) (HCC) 05/30/2017   Old MI (myocardial infarction) 05/22/2015   OSA on CPAP 05/30/2017   Testicular hypofunction 12/16/2015   Thrombocytopenia, unspecified (HCC) 12/16/2015   Type 2 diabetes mellitus with obesity (HCC)  05/30/2017    Past Surgical History:  Procedure Laterality Date   ANAL FISSURE REPAIR     CARDIAC CATHETERIZATION     COLOSTOMY  2015   LAPAROSCOPIC GASTRIC SLEEVE RESECTION N/A 05/30/2017   Procedure: LAPAROSCOPIC GASTRIC SLEEVE RESECTION, UPPER ENDO;  Surgeon: Gaynelle Adu, MD;  Location: [facility];  Service: General;  Laterality: N/A;   NASAL SINUS SURGERY      Current  Medications: Current Meds  Medication Sig   aspirin EC 81 MG tablet Take 81 mg by mouth daily.   atorvastatin (LIPITOR) 40 MG tablet TAKE 1 TABLET(40 MG) BY MOUTH DAILY   carvedilol (COREG) 6.25 MG tablet Take 6.25 mg by mouth 2 (two) times daily with a meal.   cetirizine (ZYRTEC) 10 MG tablet Take 10 mg by mouth daily.   Cholecalciferol (VITAMIN D-3) 5000 units TABS Take 5,000 Units by mouth every evening.   Coenzyme Q10 (COQ10) 100 MG CAPS Take 100 mg by mouth daily.   levothyroxine (SYNTHROID) 150 MCG tablet TAKE 1 TABLET(150 MCG) BY MOUTH DAILY BEFORE BREAKFAST   metFORMIN (GLUCOPHAGE) 500 MG tablet Take 1 tablet (500 mg total) by mouth 2 (two) times daily with a meal.   nitroGLYCERIN (NITROSTAT) 0.4 MG SL tablet Place 0.4 mg under the tongue every 5 (five) minutes as needed for chest pain.   ramipril (ALTACE) 2.5 MG capsule TAKE 1 CAPSULE(2.5 MG) BY MOUTH DAILY   tirzepatide (MOUNJARO) 5 MG/0.5ML Pen Inject 5 mg into the skin once a week.   UNABLE TO FIND Take 500 mg by mouth daily.  Mega Red 500 mg    UNABLE TO FIND Take 500 mg by mouth 3 (three) times daily. Med Name: Calcium Citrate   UNABLE TO FIND Take 1 tablet by mouth daily.  Bariatric Advantage    vitamin E 400 UNIT capsule Take 400 Units by mouth daily.     Allergies:   Cortisone and Prednisone   Social History   Socioeconomic History   Marital status: Married    Spouse name: Not on file   Number of children: Not on file   Years of education: Not on file   Highest education level: Not on file  Occupational History   Not on file  Tobacco Use   Smoking status: Some Days    Current packs/day: 0.00    Types: Cigarettes, Cigars    Last attempt to quit: 2008    Years since quitting: 16.6   Smokeless tobacco: Never   Tobacco comments:    occasionally   Vaping Use   Vaping status: Never Used  Substance and Sexual Activity   Alcohol use: Yes    Alcohol/week: 1.0 standard drink of alcohol    Types: 1 Cans of beer per  week    Comment: beer occasionally , sometimes a mixed drink    Drug use: No    Comment: occasionally "marijuana gummie" out of town   Sexual activity: Not on file  Other Topics Concern   Not on file  Social History Narrative   Not on file   Social Determinants of Health   Financial Resource Strain: Not on file  Food Insecurity: Not on file  Transportation Needs: Not on file  Physical Activity: Not on file  Stress: Not on file  Social Connections: Not on file     Family History: The patient's family history includes Alcohol abuse in his father; Arthritis in his brother, father, and maternal grandfather; Cancer in his father and another family member;  Depression in his mother and sister; Diabetes in his mother and another family member; Drug abuse in his sister; Early death in his sister; Heart attack in his brother, father, and maternal grandmother; Heart disease in his sister; Hyperlipidemia in his sister; Hypertension in his sister and another family member; Kidney disease in his mother; Miscarriages / [place] in his mother. There is no history of Colon cancer, Colon polyps, Esophageal cancer, Stomach cancer, or Rectal cancer.  ROS:   Please see the history of present illness.    All other systems reviewed and are negative.  EKGs/Labs/Other Studies Reviewed:    The following studies were reviewed today: EKG reveals sinus rhythm right bundle branch block.  Inferior wall myocardial infarction of undetermined age.   Recent Labs: 04/20/2023: ALT 49; BUN 11; Creatinine, Ser 0.90; Hemoglobin 14.6; Platelets 114.0; Potassium 4.3; Sodium 136; TSH 0.21  Recent Lipid Panel    Component Value Date/Time   CHOL 149 10/18/2022 0916   CHOL 110 10/03/2020 0843   TRIG 164.0 (H) 10/18/2022 0916   HDL 41.50 10/18/2022 0916   HDL 38 (L) 10/03/2020 0843   CHOLHDL 4 10/18/2022 0916   VLDL 32.8 10/18/2022 0916   LDLCALC 75 10/18/2022 0916   LDLCALC 57 10/03/2020 0843    Physical Exam:     VS:  BP 120/70   Pulse 70   Ht 5\' 11"  (1.803 m)   Wt 298 lb 1.9 oz (135.2 kg)   SpO2 93%   BMI 41.58 kg/m     Wt Readings from Last 3 Encounters:  07/01/23 298 lb 1.9 oz (135.2 kg)  04/20/23 (!) 301 lb 12.8 oz (136.9 kg)  10/18/22 (!) 318 lb (144.2 kg)     GEN: Patient is in no acute distress HEENT: Normal NECK: No JVD; No carotid bruits LYMPHATICS: No lymphadenopathy CARDIAC: Hear sounds regular, 2/6 systolic murmur at the apex. RESPIRATORY:  Clear to auscultation without rales, wheezing or rhonchi  ABDOMEN: Soft, non-tender, non-distended MUSCULOSKELETAL:  No edema; No deformity  SKIN: Warm and dry NEUROLOGIC:  Alert and oriented x 3 PSYCHIATRIC:  Normal affect   Signed, Garwin Brothers, MD  07/01/2023 8:16 AM    [facility]

## 2023-07-02 DIAGNOSIS — G4733 Obstructive sleep apnea (adult) (pediatric): Secondary | ICD-10-CM | POA: Diagnosis not present

## 2023-07-07 ENCOUNTER — Encounter (INDEPENDENT_AMBULATORY_CARE_PROVIDER_SITE_OTHER): Payer: BC Managed Care – PPO | Admitting: Family Medicine

## 2023-07-07 DIAGNOSIS — E890 Postprocedural hypothyroidism: Secondary | ICD-10-CM | POA: Diagnosis not present

## 2023-07-07 MED ORDER — LEVOTHYROXINE SODIUM 100 MCG PO TABS: 100.0000 ug | ORAL_TABLET | Freq: Every day | ORAL | 3 refills | Status: DC

## 2023-07-07 NOTE — Telephone Encounter (Signed)
Pt had labs drawn by Dr Tomie China on 07/01/23. Please advise:

## 2023-07-07 NOTE — Telephone Encounter (Signed)

## 2023-07-15 NOTE — Progress Notes (Signed)
Order(s) created erroneously. Erroneous order ID: [id number]  Order moved by: CHART CORRECTION ANALYST, FIFTEEN  Order move date/time: 07/15/2023 3:12 PM  Source Patient: [id number]  Source Contact: 07/01/2023  Destination Patient: [id number]  Destination Contact: 05/04/2023

## 2023-07-15 NOTE — Progress Notes (Signed)
Order(s) created erroneously. Erroneous order ID: [id number]  Order moved by: CHART CORRECTION ANALYST, FIFTEEN  Order move date/time: 07/15/2023 3:08 PM  Source Patient: [id number]  Source Contact: 07/01/2023  Destination Patient: [id number]  Destination Contact: 05/04/2023

## 2023-07-15 NOTE — Progress Notes (Signed)
Order(s) created erroneously. Erroneous order ID: [id number]  Order moved by: CHART CORRECTION ANALYST, FIFTEEN  Order move date/time: 07/15/2023 3:13 PM  Source Patient: [id number]  Source Contact: 07/01/2023  Destination Patient: [id number]  Destination Contact: 05/04/2023

## 2023-07-15 NOTE — Progress Notes (Signed)
Order(s) created erroneously. Erroneous order ID: [id number]  Order moved by: CHART CORRECTION ANALYST, FIFTEEN  Order move date/time: 07/15/2023 3:11 PM  Source Patient: [id number]  Source Contact: 07/01/2023  Destination Patient: [id number]  Destination Contact: 05/04/2023

## 2023-07-19 DIAGNOSIS — I2510 Atherosclerotic heart disease of native coronary artery without angina pectoris: Secondary | ICD-10-CM | POA: Diagnosis not present

## 2023-07-19 DIAGNOSIS — Z9989 Dependence on other enabling machines and devices: Secondary | ICD-10-CM | POA: Diagnosis not present

## 2023-07-19 DIAGNOSIS — Z955 Presence of coronary angioplasty implant and graft: Secondary | ICD-10-CM | POA: Diagnosis not present

## 2023-07-19 DIAGNOSIS — G4733 Obstructive sleep apnea (adult) (pediatric): Secondary | ICD-10-CM | POA: Diagnosis not present

## 2023-07-26 DIAGNOSIS — H1045 Other chronic allergic conjunctivitis: Secondary | ICD-10-CM | POA: Diagnosis not present

## 2023-07-26 DIAGNOSIS — J3089 Other allergic rhinitis: Secondary | ICD-10-CM | POA: Diagnosis not present

## 2023-08-08 DIAGNOSIS — M542 Cervicalgia: Secondary | ICD-10-CM | POA: Diagnosis not present

## 2023-08-08 DIAGNOSIS — M9902 Segmental and somatic dysfunction of thoracic region: Secondary | ICD-10-CM | POA: Diagnosis not present

## 2023-08-08 DIAGNOSIS — M9901 Segmental and somatic dysfunction of cervical region: Secondary | ICD-10-CM | POA: Diagnosis not present

## 2023-09-13 LAB — HM DIABETES EYE EXAM

## 2023-09-15 ENCOUNTER — Telehealth: Payer: Self-pay | Admitting: Cardiology

## 2023-09-15 MED ORDER — CARVEDILOL 6.25 MG PO TABS: 6.2500 mg | ORAL_TABLET | Freq: Two times a day (BID) | ORAL | 3 refills | Status: DC

## 2023-09-15 NOTE — Telephone Encounter (Signed)
  *  STAT* If patient is at the pharmacy, call can be transferred to refill team.   1. Which medications need to be refilled? (please list name of each medication and dose if known)  carvedilol (COREG) 6.25 MG tablet    2. Would you like to learn more about the convenience, safety, & potential cost savings by using the [facility]?    3. Are you open to using the [facility] (Type [facility].   4. Which pharmacy/location (including street and city if local pharmacy) is medication to be sent to?  [id number] - [place], [place] - [address]     5. Do they need a 30 day or 90 day supply? 90 days

## 2023-09-15 NOTE — Telephone Encounter (Signed)
RX sent

## 2023-09-25 ENCOUNTER — Other Ambulatory Visit: Payer: Self-pay | Admitting: Family Medicine

## 2023-09-25 DIAGNOSIS — E890 Postprocedural hypothyroidism: Secondary | ICD-10-CM

## 2023-10-26 ENCOUNTER — Other Ambulatory Visit: Payer: Self-pay | Admitting: Family Medicine

## 2023-10-26 DIAGNOSIS — E890 Postprocedural hypothyroidism: Secondary | ICD-10-CM

## 2023-11-07 ENCOUNTER — Encounter: Payer: Self-pay | Admitting: Family Medicine

## 2023-11-08 NOTE — Progress Notes (Deleted)
[facility] [address] [place], [place] [address] [phone]  Date:  11/09/2023   Name:  Scott Bauer   DOB:  1956/07/06   MRN:  [id number]  PCP:  Pearline Cables, MD    Chief Complaint: No chief complaint on file.   History of Present Illness:  Scott Bauer is a 67 y.o. very pleasant male patient who presents with the following:  Patient seen today for diabetes follow-up.  He is applying for a medication assistance program and needs an updated A1c Most recent visit with myself was in May of this year  History of MI, HTN, OSA, hyperlipidemia, DM, hypothymism, idiopathic thrombocytopenia S/p PCI in 2008 and 2010 He had gastric sleeve surgery in 2018 History of significant smoking-lung cancer screening CT done June 2024  Sees pulmonology for sleep apnea Cardiology, Dr. Tomie China  Colon cancer screening COVID booster Due for urine micro Foot exam Can update A1c today-otherwise he had labs done in August  We have also been following elevated alk phos   Patient Active Problem List   Diagnosis Date Noted   Colon polyp 10/08/2021   Diabetes mellitus without complication (HCC) 10/08/2021   Fatty liver 10/08/2021   Hypothyroidism 10/08/2021   Allergic rhinitis 10/08/2021   Idiopathic thrombocytopenia (HCC) 01/03/2019   History of MI (myocardial infarction) 01/03/2019   Erectile dysfunction 04/09/2018   History of right coronary artery stent placement 07/01/2017   H/O bariatric surgery 06/08/2017   Obesity, Class III, BMI 40-49.9 (morbid obesity) (HCC) 05/30/2017   Type 2 diabetes mellitus with obesity (HCC) 05/30/2017   Essential hypertension 05/30/2017   OSA on CPAP 05/30/2017   Hypercholesterolemia 05/30/2017   Family history of abdominal aortic aneurysm (AAA) 12/16/2015   Testicular hypofunction 12/16/2015   Thrombocytopenia, unspecified (HCC) 12/16/2015   Coronary artery disease involving  native coronary artery of native heart without angina pectoris 05/22/2015   Old MI (myocardial infarction) 05/22/2015   Coronary arteriosclerosis in native artery 05/22/2015   Multiple-type hyperlipidemia 05/22/2015    Past Medical History:  Diagnosis Date   Allergic rhinitis 10/08/2021   Colon polyp    Coronary arteriosclerosis in native artery 05/22/2015   Coronary artery disease involving native coronary artery of native heart without angina pectoris 05/22/2015   Diabetes mellitus without complication (HCC) 10/08/2021   type 2  on no meds     Erectile dysfunction 04/09/2018   Essential hypertension 05/30/2017   Family history of abdominal aortic aneurysm (AAA) 12/16/2015   Fatty liver    H/O bariatric surgery 06/08/2017   History of MI (myocardial infarction) 01/03/2019   History of right coronary artery stent placement 07/01/2017   Hypercholesterolemia 05/30/2017   Hypothyroidism    Idiopathic thrombocytopenia (HCC) 01/03/2019   Multiple-type hyperlipidemia 05/22/2015   Obesity, Class III, BMI 40-49.9 (morbid obesity) (HCC) 05/30/2017   Old MI (myocardial infarction) 05/22/2015   OSA on CPAP 05/30/2017   Testicular hypofunction 12/16/2015   Thrombocytopenia, unspecified (HCC) 12/16/2015   Type 2 diabetes mellitus with obesity (HCC) 05/30/2017    Past Surgical History:  Procedure Laterality Date   ANAL FISSURE REPAIR     CARDIAC CATHETERIZATION     COLOSTOMY  2015   LAPAROSCOPIC GASTRIC SLEEVE RESECTION N/A 05/30/2017   Procedure: LAPAROSCOPIC GASTRIC SLEEVE RESECTION, UPPER ENDO;  Surgeon: Gaynelle Adu, MD;  Location: [facility];  Service: General;  Laterality: N/A;   NASAL SINUS SURGERY  Social History   Tobacco Use   Smoking status: Some Days    Current packs/day: 0.00    Types: Cigarettes, Cigars    Last attempt to quit: 2008    Years since quitting: 16.9   Smokeless tobacco: Never   Tobacco comments:    occasionally   Vaping Use   Vaping status: Never Used   Substance Use Topics   Alcohol use: Yes    Alcohol/week: 1.0 standard drink of alcohol    Types: 1 Cans of beer per week    Comment: beer occasionally , sometimes a mixed drink    Drug use: No    Comment: occasionally "marijuana gummie" out of town    Family History  Problem Relation Age of Onset   Cancer Other    Hypertension Other    Diabetes Other    Depression Mother    Diabetes Mother    Kidney disease Mother    Miscarriages / [place] Mother    Alcohol abuse Father    Arthritis Father    Cancer Father    Heart attack Father    Heart disease Sister    Hyperlipidemia Sister    Hypertension Sister    Depression Sister    Drug abuse Sister    Early death Sister    Arthritis Brother    Heart attack Brother    Heart attack Maternal Grandmother    Arthritis Maternal Grandfather    Colon cancer Neg Hx    Colon polyps Neg Hx    Esophageal cancer Neg Hx    Stomach cancer Neg Hx    Rectal cancer Neg Hx     Allergies  Allergen Reactions   Cortisone    Prednisone Nausea Only and Other (See Comments)    Makes him feel funny  Only has issues when takes dose packs    Medication list has been reviewed and updated.  Current Outpatient Medications on File Prior to Visit  Medication Sig Dispense Refill   aspirin EC 81 MG tablet Take 81 mg by mouth daily.     atorvastatin (LIPITOR) 40 MG tablet TAKE 1 TABLET(40 MG) BY MOUTH DAILY 90 tablet 3   carvedilol (COREG) 6.25 MG tablet Take 1 tablet (6.25 mg total) by mouth 2 (two) times daily with a meal. 180 tablet 3   cetirizine (ZYRTEC) 10 MG tablet Take 10 mg by mouth daily.     Cholecalciferol (VITAMIN D-3) 5000 units TABS Take 5,000 Units by mouth every evening.     Coenzyme Q10 (COQ10) 100 MG CAPS Take 100 mg by mouth daily.     levothyroxine (SYNTHROID) 100 MCG tablet Take 1 tablet (100 mcg total) by mouth daily before breakfast. 30 tablet 0   metFORMIN (GLUCOPHAGE) 500 MG tablet Take 1 tablet (500 mg total) by mouth 2  (two) times daily with a meal. 180 tablet 3   nitroGLYCERIN (NITROSTAT) 0.4 MG SL tablet Place 1 tablet (0.4 mg total) under the tongue every 5 (five) minutes as needed for chest pain. 25 tablet 6   ramipril (ALTACE) 2.5 MG capsule TAKE 1 CAPSULE(2.5 MG) BY MOUTH DAILY 90 capsule 3   tirzepatide (MOUNJARO) 5 MG/0.5ML Pen Inject 5 mg into the skin once a week. 2 mL 2   UNABLE TO FIND Take 500 mg by mouth daily.  Mega Red 500 mg      UNABLE TO FIND Take 500 mg by mouth 3 (three) times daily. Med Name: Calcium Citrate     UNABLE TO  FIND Take 1 tablet by mouth daily.  Bariatric Advantage      vitamin E 400 UNIT capsule Take 400 Units by mouth daily.     No current facility-administered medications on file prior to visit.    Review of Systems:  As per HPI- otherwise negative.   Physical Examination: There were no vitals filed for this visit. There were no vitals filed for this visit. There is no height or weight on file to calculate BMI. Ideal Body Weight:    GEN: no acute distress. HEENT: Atraumatic, Normocephalic.  Ears and Nose: No external deformity. CV: RRR, No M/G/R. No JVD. No thrill. No extra heart sounds. PULM: CTA B, no wheezes, crackles, rhonchi. No retractions. No resp. distress. No accessory muscle use. ABD: S, NT, ND, +BS. No rebound. No HSM. EXTR: No c/c/e PSYCH: Normally interactive. Conversant.  Foot exam  Assessment and Plan: ***  Signed Abbe Amsterdam, MD

## 2023-11-09 ENCOUNTER — Ambulatory Visit: Payer: BC Managed Care – PPO | Admitting: Family Medicine

## 2023-11-09 ENCOUNTER — Ambulatory Visit (INDEPENDENT_AMBULATORY_CARE_PROVIDER_SITE_OTHER): Payer: BC Managed Care – PPO | Admitting: Family Medicine

## 2023-11-09 ENCOUNTER — Encounter: Payer: Self-pay | Admitting: Family Medicine

## 2023-11-09 VITALS — BP 118/74 | HR 66 | Temp 97.8°F | Resp 18 | Ht 71.0 in | Wt 302.4 lb

## 2023-11-09 DIAGNOSIS — E890 Postprocedural hypothyroidism: Secondary | ICD-10-CM

## 2023-11-09 DIAGNOSIS — Z125 Encounter for screening for malignant neoplasm of prostate: Secondary | ICD-10-CM

## 2023-11-09 DIAGNOSIS — R748 Abnormal levels of other serum enzymes: Secondary | ICD-10-CM

## 2023-11-09 DIAGNOSIS — E669 Obesity, unspecified: Secondary | ICD-10-CM

## 2023-11-09 DIAGNOSIS — E1169 Type 2 diabetes mellitus with other specified complication: Secondary | ICD-10-CM | POA: Diagnosis not present

## 2023-11-09 DIAGNOSIS — Z7985 Long-term (current) use of injectable non-insulin antidiabetic drugs: Secondary | ICD-10-CM

## 2023-11-09 LAB — HEPATIC FUNCTION PANEL
ALT: 50 U/L (ref 0–53)
AST: 27 U/L (ref 0–37)
Albumin: 4.1 g/dL (ref 3.5–5.2)
Alkaline Phosphatase: 133 U/L — ABNORMAL HIGH (ref 39–117)
Bilirubin, Direct: 0.1 mg/dL (ref 0.0–0.3)
Total Bilirubin: 0.7 mg/dL (ref 0.2–1.2)
Total Protein: 6.7 g/dL (ref 6.0–8.3)

## 2023-11-09 LAB — MICROALBUMIN / CREATININE URINE RATIO
Creatinine,U: 203.7 mg/dL
Microalb Creat Ratio: 1.2 mg/g (ref 0.0–30.0)
Microalb, Ur: 2.5 mg/dL — ABNORMAL HIGH (ref 0.0–1.9)

## 2023-11-09 LAB — HEMOGLOBIN A1C: Hgb A1c MFr Bld: 6.7 % — ABNORMAL HIGH (ref 4.6–6.5)

## 2023-11-09 LAB — GAMMA GT: GGT: 68 U/L — ABNORMAL HIGH (ref 7–51)

## 2023-11-09 LAB — TSH: TSH: 10.73 u[IU]/mL — ABNORMAL HIGH (ref 0.35–5.50)

## 2023-11-09 MED ORDER — TIRZEPATIDE 5 MG/0.5ML ~~LOC~~ SOAJ: 5.0000 mg | SUBCUTANEOUS | 3 refills | Status: DC

## 2023-11-09 NOTE — Patient Instructions (Addendum)
Good to see you today- I will be in touch with your labs It looks like you are overdue for a colonoscopy!  Please call  GI and set this up asap  I will get your forms for the [facility] sent in as soon as your A1c comes back   I gave you a month of Mounjaro 2.5 to get you back on therapy while we work on getting the 5mg  back

## 2023-11-09 NOTE — Progress Notes (Signed)
[facility]care at [facility] [address], [address] [place], [place] [address] [phone]  Date:  11/09/2023   Name:  Scott Bauer   DOB:  [birth year]   MRN:  [id number]  PCP:  Pearline Cables, MD    Chief Complaint: Follow-up (Concerns/ questions: pt has not been on Mounjaro in about 3 weeks/Urine ma, foot exam, A1c due/Cscope due)   History of Present Illness:  Scott Bauer is a 67 y.o. very pleasant male patient who presents with the following:  Patient seen today for diabetes follow-up.  He is applying for a medication assistance program and needs an updated A1c. Once we have A1c I can complete and send in paperwork Most recent visit with myself was in May of this year  History of MI, HTN, OSA, hyperlipidemia, DM, hypothymism, idiopathic thrombocytopenia S/p PCI in 2008 and 2010 He had gastric sleeve surgery in 2018 History of significant smoking-lung cancer screening CT done June 2024  Sees pulmonology for sleep apnea Cardiology, Dr. Tomie China  Colon cancer screening- this is overdue, reminded pt and he is aware COVID booster-done Due for urine micro-update today Foot exam-update today Can update A1c today-otherwise he had labs done in August  He ran out of [facility] about 3 weeks ago- he did notice it was working well for him.  He just ran out and needs to react on his medication assistance program.  He was taking 5 mg with success  We have also been following elevated alk phos  Lab Results  Component Value Date   HGBA1C 10.7 (H) 04/20/2023   Wt Readings from Last 3 Encounters:  11/09/23 (!) 302 lb 6.4 oz (137.2 kg)  07/01/23 298 lb 1.9 oz (135.2 kg)  04/20/23 (!) 301 lb 12.8 oz (136.9 kg)      Patient Active Problem List   Diagnosis Date Noted   Colon polyp 10/08/2021   Diabetes mellitus without complication (HCC) 10/08/2021   Fatty liver 10/08/2021   Hypothyroidism 10/08/2021   Allergic rhinitis 10/08/2021    Idiopathic thrombocytopenia (HCC) 01/03/2019   History of MI (myocardial infarction) 01/03/2019   Erectile dysfunction 04/09/2018   History of right coronary artery stent placement 07/01/2017   H/O bariatric surgery 06/08/2017   Obesity, Class III, BMI 40-49.9 (morbid obesity) (HCC) 05/30/2017   Type 2 diabetes mellitus with obesity (HCC) 05/30/2017   Essential hypertension 05/30/2017   OSA on CPAP 05/30/2017   Hypercholesterolemia 05/30/2017   Family history of abdominal aortic aneurysm (AAA) 12/16/2015   Testicular hypofunction 12/16/2015   Thrombocytopenia, unspecified (HCC) 12/16/2015   Coronary artery disease involving native coronary artery of native heart without angina pectoris 05/22/2015   Old MI (myocardial infarction) 05/22/2015   Coronary arteriosclerosis in native artery 05/22/2015   Multiple-type hyperlipidemia 05/22/2015    Past Medical History:  Diagnosis Date   Allergic rhinitis 10/08/2021   Colon polyp    Coronary arteriosclerosis in native artery 05/22/2015   Coronary artery disease involving native coronary artery of native heart without angina pectoris 05/22/2015   Diabetes mellitus without complication (HCC) 10/08/2021   type 2  on no meds     Erectile dysfunction 04/09/2018   Essential hypertension 05/30/2017   Family history of abdominal aortic aneurysm (AAA) 12/16/2015   Fatty liver    H/O bariatric surgery 06/08/2017   History of MI (myocardial infarction) 01/03/2019   History of right coronary artery stent placement 07/01/2017   Hypercholesterolemia 05/30/2017   Hypothyroidism  Idiopathic thrombocytopenia (HCC) 01/03/2019   Multiple-type hyperlipidemia 05/22/2015   Obesity, Class III, BMI 40-49.9 (morbid obesity) (HCC) 05/30/2017   Old MI (myocardial infarction) 05/22/2015   OSA on CPAP 05/30/2017   Testicular hypofunction 12/16/2015   Thrombocytopenia, unspecified (HCC) 12/16/2015   Type 2 diabetes mellitus with obesity (HCC) 05/30/2017     Past Surgical History:  Procedure Laterality Date   ANAL FISSURE REPAIR     CARDIAC CATHETERIZATION     COLOSTOMY  2015   LAPAROSCOPIC GASTRIC SLEEVE RESECTION N/A 05/30/2017   Procedure: LAPAROSCOPIC GASTRIC SLEEVE RESECTION, UPPER ENDO;  Surgeon: Gaynelle Adu, MD;  Location: [facility];  Service: General;  Laterality: N/A;   NASAL SINUS SURGERY      Social History   Tobacco Use   Smoking status: Some Days    Current packs/day: 0.00    Types: Cigarettes, Cigars    Last attempt to quit: 2008    Years since quitting: 16.9   Smokeless tobacco: Never   Tobacco comments:    occasionally   Vaping Use   Vaping status: Never Used  Substance Use Topics   Alcohol use: Yes    Alcohol/week: 1.0 standard drink of alcohol    Types: 1 Cans of beer per week    Comment: beer occasionally , sometimes a mixed drink    Drug use: No    Comment: occasionally "marijuana gummie" out of town    Family History  Problem Relation Age of Onset   Cancer Other    Hypertension Other    Diabetes Other    Depression Mother    Diabetes Mother    Kidney disease Mother    Miscarriages / [place] Mother    Alcohol abuse Father    Arthritis Father    Cancer Father    Heart attack Father    Heart disease Sister    Hyperlipidemia Sister    Hypertension Sister    Depression Sister    Drug abuse Sister    Early death Sister    Arthritis Brother    Heart attack Brother    Heart attack Maternal Grandmother    Arthritis Maternal Grandfather    Colon cancer Neg Hx    Colon polyps Neg Hx    Esophageal cancer Neg Hx    Stomach cancer Neg Hx    Rectal cancer Neg Hx     Allergies  Allergen Reactions   Cortisone    Prednisone Nausea Only and Other (See Comments)    Makes him feel funny  Only has issues when takes dose packs    Medication list has been reviewed and updated.  Current Outpatient Medications on File Prior to Visit  Medication Sig Dispense Refill   aspirin EC 81 MG tablet Take  81 mg by mouth daily.     atorvastatin (LIPITOR) 40 MG tablet TAKE 1 TABLET(40 MG) BY MOUTH DAILY 90 tablet 3   carvedilol (COREG) 6.25 MG tablet Take 1 tablet (6.25 mg total) by mouth 2 (two) times daily with a meal. 180 tablet 3   cetirizine (ZYRTEC) 10 MG tablet Take 10 mg by mouth daily.     Cholecalciferol (VITAMIN D-3) 5000 units TABS Take 5,000 Units by mouth every evening.     Coenzyme Q10 (COQ10) 100 MG CAPS Take 100 mg by mouth daily.     levothyroxine (SYNTHROID) 100 MCG tablet Take 1 tablet (100 mcg total) by mouth daily before breakfast. 30 tablet 0   metFORMIN (GLUCOPHAGE) 500 MG tablet Take 1  tablet (500 mg total) by mouth 2 (two) times daily with a meal. 180 tablet 3   nitroGLYCERIN (NITROSTAT) 0.4 MG SL tablet Place 1 tablet (0.4 mg total) under the tongue every 5 (five) minutes as needed for chest pain. 25 tablet 6   ramipril (ALTACE) 2.5 MG capsule TAKE 1 CAPSULE(2.5 MG) BY MOUTH DAILY 90 capsule 3   tirzepatide (MOUNJARO) 5 MG/0.5ML Pen Inject 5 mg into the skin once a week. 2 mL 2   UNABLE TO FIND Take 500 mg by mouth daily.  Mega Red 500 mg      UNABLE TO FIND Take 500 mg by mouth 3 (three) times daily. Med Name: Calcium Citrate     UNABLE TO FIND Take 1 tablet by mouth daily.  Bariatric Advantage      vitamin E 400 UNIT capsule Take 400 Units by mouth daily.     No current facility-administered medications on file prior to visit.    Review of Systems:  As per HPI- otherwise negative.   Physical Examination: Vitals:   11/09/23 0819  BP: 118/74  Pulse: 66  Resp: 18  Temp: 97.8 F (36.6 C)  SpO2: 97%   Vitals:   11/09/23 0819  Weight: (!) 302 lb 6.4 oz (137.2 kg)  Height: 5\' 11"  (1.803 m)   Body mass index is 42.18 kg/m. Ideal Body Weight: Weight in (lb) to have BMI = 25: 178.9  GEN: no acute distress.  Obese, looks well  HEENT: Atraumatic, Normocephalic.  Ears and Nose: No external deformity. CV: RRR, No M/G/R. No JVD. No thrill. No extra heart  sounds. PULM: CTA B, no wheezes, crackles, rhonchi. No retractions. No resp. distress. No accessory muscle use. ABD: S, NT, ND, +BS. No rebound. No HSM. EXTR: No c/c/e PSYCH: Normally interactive. Conversant.  Foot exam normal today   Assessment and Plan: Elevated alkaline phosphatase level - Plan: Gamma GT, Alkaline phosphatase, bone specific, Hepatic function panel  Postablative hypothyroidism - Plan: TSH  Type 2 diabetes mellitus with obesity (HCC) - Plan: Hemoglobin A1c, Microalbumin / creatinine urine ratio  Patient seen today for follow-up.  Will workup elevated alkaline phosphatase as above  His most recent TSH was abnormal, levothyroxine dose was adjusted.  Will follow-up on TSH today  Follow-up on A1c, once I receive this I can complete his medication assistance paperwork.  I did give him a sample of Mounjaro 2.5 mg x 4 weeks as he has let his medication lapse- this will get him back started and he can ease back onto his usual dosage Will plan further follow- up pending labs.   Signed Abbe Amsterdam, MD  Received labs as below, message to patient  Results for orders placed or performed in visit on 11/09/23  Hemoglobin A1c   Collection Time: 11/09/23  8:38 AM  Result Value Ref Range   Hgb A1c MFr Bld 6.7 (H) 4.6 - 6.5 %  TSH   Collection Time: 11/09/23  8:38 AM  Result Value Ref Range   TSH 10.73 (H) 0.35 - 5.50 uIU/mL  Gamma GT   Collection Time: 11/09/23  8:38 AM  Result Value Ref Range   GGT 68 (H) 7 - 51 U/L  Hepatic function panel   Collection Time: 11/09/23  8:38 AM  Result Value Ref Range   Total Bilirubin 0.7 0.2 - 1.2 mg/dL   Bilirubin, Direct 0.1 0.0 - 0.3 mg/dL   Alkaline Phosphatase 133 (H) 39 - 117 U/L   AST 27 0 - 37  U/L   ALT 50 0 - 53 U/L   Total Protein 6.7 6.0 - 8.3 g/dL   Albumin 4.1 3.5 - 5.2 g/dL  Microalbumin / creatinine urine ratio   Collection Time: 11/09/23  8:38 AM  Result Value Ref Range   Microalb, Ur 2.5 (H) 0.0 - 1.9 mg/dL    Creatinine,U 846.9 mg/dL   Microalb Creat Ratio 1.2 0.0 - 30.0 mg/g

## 2023-11-11 LAB — ALKALINE PHOSPHATASE, BONE SPECIFIC: ALKALINE PHOSPHATASE, BONE SPECIFIC: 17.7 ug/L — ABNORMAL HIGH (ref 7.6–14.9)

## 2023-11-20 ENCOUNTER — Encounter: Payer: Self-pay | Admitting: Family Medicine

## 2023-11-20 DIAGNOSIS — K769 Liver disease, unspecified: Secondary | ICD-10-CM

## 2023-11-20 DIAGNOSIS — E890 Postprocedural hypothyroidism: Secondary | ICD-10-CM

## 2023-11-21 ENCOUNTER — Ambulatory Visit (HOSPITAL_BASED_OUTPATIENT_CLINIC_OR_DEPARTMENT_OTHER)
Admission: RE | Admit: 2023-11-21 | Discharge: 2023-11-21 | Disposition: A | Discharge status: Home / Self Care | Payer: BC Managed Care – PPO | Source: Ambulatory Visit | Attending: Family Medicine | Admitting: Family Medicine

## 2023-11-21 DIAGNOSIS — R748 Abnormal levels of other serum enzymes: Secondary | ICD-10-CM | POA: Diagnosis not present

## 2023-11-21 DIAGNOSIS — R932 Abnormal findings on diagnostic imaging of liver and biliary tract: Secondary | ICD-10-CM | POA: Diagnosis not present

## 2023-11-21 DIAGNOSIS — R7989 Other specified abnormal findings of blood chemistry: Secondary | ICD-10-CM | POA: Diagnosis not present

## 2023-11-21 MED ORDER — LEVOTHYROXINE SODIUM 125 MCG PO TABS: 125.0000 ug | ORAL_TABLET | Freq: Every day | ORAL | 3 refills | Status: DC

## 2023-11-24 ENCOUNTER — Encounter: Payer: Self-pay | Admitting: Family Medicine

## 2023-12-21 ENCOUNTER — Encounter (HOSPITAL_COMMUNITY): Payer: Self-pay | Admitting: *Deleted

## 2024-02-13 ENCOUNTER — Other Ambulatory Visit: Payer: Self-pay | Admitting: Family Medicine

## 2024-02-13 DIAGNOSIS — E1169 Type 2 diabetes mellitus with other specified complication: Secondary | ICD-10-CM

## 2024-03-30 ENCOUNTER — Other Ambulatory Visit: Payer: Self-pay

## 2024-03-30 ENCOUNTER — Ambulatory Visit: Attending: Cardiology | Admitting: Cardiology

## 2024-03-30 ENCOUNTER — Encounter: Payer: Self-pay | Admitting: Cardiology

## 2024-03-30 VITALS — BP 128/78 | HR 60 | Ht 71.0 in | Wt 305.1 lb

## 2024-03-30 DIAGNOSIS — E119 Type 2 diabetes mellitus without complications: Secondary | ICD-10-CM

## 2024-03-30 DIAGNOSIS — G4733 Obstructive sleep apnea (adult) (pediatric): Secondary | ICD-10-CM

## 2024-03-30 DIAGNOSIS — I2510 Atherosclerotic heart disease of native coronary artery without angina pectoris: Secondary | ICD-10-CM

## 2024-03-30 DIAGNOSIS — I10 Essential (primary) hypertension: Secondary | ICD-10-CM

## 2024-03-30 DIAGNOSIS — Z9884 Bariatric surgery status: Secondary | ICD-10-CM

## 2024-03-30 NOTE — Addendum Note (Signed)
 Addended by: Tywone Bembenek R on: 03/30/2024 02:47 PM   Modules accepted: Orders

## 2024-03-30 NOTE — Addendum Note (Signed)
 Addended by: Aurelio Leer I on: 03/30/2024 02:46 PM   Modules accepted: Orders

## 2024-03-30 NOTE — Patient Instructions (Signed)
 Medication Instructions:  Your physician recommends that you continue on your current medications as directed. Please refer to the Current Medication list given to you today.  *If you need a refill on your cardiac medications before your next appointment, please call your pharmacy*  Lab Work: Your physician recommends that you return for lab work in:   Labs the day of your stress test: BMP, CBC, TSH, LFT, Lipids, Hbg A1c, Vitamin D  If you have labs (blood work) drawn today and your tests are completely normal, you will receive your results only by: MyChart Message (if you have MyChart) OR A paper copy in the mail If you have any lab test that is abnormal or we need to change your treatment, we will call you to review the results.  Testing/Procedures:   [facility] Cardiovascular Imaging at [facility] [address] [place], [place] [address] Phone: [phone]    Please arrive 15 minutes prior to your appointment time for registration and insurance purposes.  The test will take approximately 3 to 4 hours to complete; you may bring reading material.  If someone comes with you to your appointment, they will need to remain in the main lobby due to limited space in the testing area. **If you are pregnant or breastfeeding, please notify the nuclear lab prior to your appointment**  How to prepare for your Myocardial Perfusion Test: Do not eat or drink 3 hours prior to your test, except you may have water. Do not consume products containing caffeine (regular or decaffeinated) 12 hours prior to your test. (ex: coffee, chocolate, sodas, tea). Do bring a list of your current medications with you.  If not listed below, you may take your medications as normal. Do not take carvedilol  (Coreg ) for 24 hours prior to the test.  Bring the medication to your appointment as you may be required to take it once the test is complete. HOLD diabetic medication: Metformin  the morning of the test Do  wear comfortable clothes (no dresses or overalls) and walking shoes, tennis shoes preferred (No heels or open toe shoes are allowed). Do NOT wear cologne, perfume, aftershave, or lotions (deodorant is allowed). If these instructions are not followed, your test will have to be rescheduled.  Please report to [address] ([facility]), 2nd Floor, for your test.  If you have questions or concerns about your appointment, you can call the Nuclear Lab at [phone].  If you cannot keep your appointment, please provide 24 hours notification to the Nuclear Lab, to avoid a possible $50 charge to your account.   Follow-Up: At [facility], you and your health needs are our priority.  As part of our continuing mission to provide you with exceptional heart care, our providers are all part of one team.  This team includes your primary Cardiologist (physician) and Advanced Practice Providers or APPs (Physician Assistants and Nurse Practitioners) who all work together to provide you with the care you need, when you need it.  Your next appointment:   9 month(s)  Provider:   Hillis Lu, MD    We recommend signing up for the patient portal called "MyChart".  Sign up information is provided on this After Visit Summary.  MyChart is used to connect with patients for Virtual Visits (Telemedicine).  Patients are able to view lab/test results, encounter notes, upcoming appointments, etc.  Non-urgent messages can be sent to your provider as well.   To learn more  about what you can do with MyChart, go to [email or web address].   Other Instructions None

## 2024-03-30 NOTE — Progress Notes (Signed)
 Cardiology Office Note:    Date:  03/30/2024   ID:  Scott Bauer, DOB 1956/08/14, MRN [id number]  PCP:  Kaylee Partridge, MD  Cardiologist:  Nelia Balzarine, MD   Referring MD: Kaylee Partridge, MD    ASSESSMENT:    1. Coronary artery disease involving native coronary artery of native heart without angina pectoris   2. Essential hypertension   3. OSA on CPAP   4. Diabetes mellitus without complication (HCC)   5. H/O bariatric surgery    PLAN:    In order of problems listed above:  Coronary artery disease: Secondary prevention stressed with the patient.  Importance of compliance with diet medication stressed any vocalized understanding.  He has some dyspnea on exertion.  It has been a long time since he had some evaluation.  He leads a sedentary lifestyle.  He discussed stress testing and we will set him up for an exercise stress Cardiolite. Essential hypertension: Blood pressure is stable and diet was emphasized. Mixed dyslipidemia, diabetes mellitus and morbid obesity: Lifestyle modification urged diet emphasized.  He promises to do better.  He will have complete blood work when he comes for the stress test.  He will also get hemoglobin A1c and vitamin D in view of history of deficiency. Patient will be seen in follow-up appointment in 9 months or earlier if the patient has any concerns.    Medication Adjustments/Labs and Tests Ordered: Current medicines are reviewed at length with the patient today.  Concerns regarding medicines are outlined above.  No orders of the defined types were placed in this encounter.  No orders of the defined types were placed in this encounter.    No chief complaint on file.    History of Present Illness:    Scott Bauer is a 68 y.o. male.  Patient has past medical history of coronary artery disease post stenting, essential hypertension, mixed dyslipidemia, diabetes mellitus and morbid obesity.  He has a Health and safety inspector job.  He denies any  problems at this time and takes care of activities of daily living.  He ambulates on a regular basis but does not exercise.  At the time of my evaluation, the patient is alert awake oriented and in no distress.  He has some dyspnea on exertion  Past Medical History:  Diagnosis Date   Allergic rhinitis 10/08/2021   Colon polyp    Coronary arteriosclerosis in native artery 05/22/2015   Coronary artery disease involving native coronary artery of native heart without angina pectoris 05/22/2015   Diabetes mellitus without complication (HCC) 10/08/2021   type 2  on no meds     Erectile dysfunction 04/09/2018   Essential hypertension 05/30/2017   Family history of abdominal aortic aneurysm (AAA) 12/16/2015   Fatty liver    H/O bariatric surgery 06/08/2017   History of MI (myocardial infarction) 01/03/2019   History of right coronary artery stent placement 07/01/2017   Hypercholesterolemia 05/30/2017   Hypothyroidism    Idiopathic thrombocytopenia (HCC) 01/03/2019   Multiple-type hyperlipidemia 05/22/2015   Obesity, Class III, BMI 40-49.9 (morbid obesity) 05/30/2017   Old MI (myocardial infarction) 05/22/2015   OSA on CPAP 05/30/2017   Testicular hypofunction 12/16/2015   Thrombocytopenia, unspecified (HCC) 12/16/2015   Type 2 diabetes mellitus with obesity (HCC) 05/30/2017    Past Surgical History:  Procedure Laterality Date   ANAL FISSURE REPAIR     CARDIAC CATHETERIZATION     COLOSTOMY  2015   LAPAROSCOPIC GASTRIC SLEEVE RESECTION N/A 05/30/2017  Procedure: LAPAROSCOPIC GASTRIC SLEEVE RESECTION, UPPER ENDO;  Surgeon: Aldean Hummingbird, MD;  Location: [facility];  Service: General;  Laterality: N/A;   NASAL SINUS SURGERY      Current Medications: Current Meds  Medication Sig   aspirin EC 81 MG tablet Take 81 mg by mouth daily.   atorvastatin  (LIPITOR) 40 MG tablet TAKE 1 TABLET(40 MG) BY MOUTH DAILY   carvedilol  (COREG ) 6.25 MG tablet Take 1 tablet (6.25 mg total) by mouth 2 (two) times  daily with a meal.   cetirizine (ZYRTEC) 10 MG tablet Take 10 mg by mouth daily.   Cholecalciferol (VITAMIN D-3) 5000 units TABS Take 5,000 Units by mouth every evening.   Coenzyme Q10 (COQ10) 100 MG CAPS Take 100 mg by mouth daily.   levothyroxine  (SYNTHROID ) 125 MCG tablet Take 1 tablet (125 mcg total) by mouth daily.   metFORMIN  (GLUCOPHAGE ) 500 MG tablet TAKE 1 TABLET(500 MG) BY MOUTH TWICE DAILY WITH A MEAL   nitroGLYCERIN  (NITROSTAT ) 0.4 MG SL tablet Place 1 tablet (0.4 mg total) under the tongue every 5 (five) minutes as needed for chest pain.   ramipril  (ALTACE ) 2.5 MG capsule TAKE 1 CAPSULE(2.5 MG) BY MOUTH DAILY   UNABLE TO FIND Take 500 mg by mouth daily.  Mega Red 500 mg    UNABLE TO FIND Take 500 mg by mouth 3 (three) times daily. Med Name: Calcium  Citrate   UNABLE TO FIND Take 1 tablet by mouth daily.  Bariatric Advantage    vitamin E 400 UNIT capsule Take 400 Units by mouth daily.     Allergies:   Cortisone and Prednisone    Social History   Socioeconomic History   Marital status: Married    Spouse name: Not on file   Number of children: Not on file   Years of education: Not on file   Highest education level: Not on file  Occupational History   Not on file  Tobacco Use   Smoking status: Some Days    Current packs/day: 0.00    Types: Cigarettes, Cigars    Last attempt to quit: 2008    Years since quitting: 17.3   Smokeless tobacco: Never   Tobacco comments:    occasionally   Vaping Use   Vaping status: Never Used  Substance and Sexual Activity   Alcohol use: Yes    Alcohol/week: 1.0 standard drink of alcohol    Types: 1 Cans of beer per week    Comment: beer occasionally , sometimes a mixed drink    Drug use: No    Comment: occasionally "marijuana gummie" out of town   Sexual activity: Not on file  Other Topics Concern   Not on file  Social History Narrative   Not on file   Social Drivers of Health   Financial Resource Strain: Not on file  Food  Insecurity: Low Risk  (07/19/2023)   Received from [facility]   Hunger Vital Sign    Worried About Running Out of Food in the Last Year: Never true    Ran Out of Food in the Last Year: Never true  Transportation Needs: No Transportation Needs (07/19/2023)   Received from [facility]    In the past 12 months, has lack of reliable transportation kept you from medical appointments, meetings, work or from getting things needed for daily living? : No  Physical Activity: Not on file  Stress: Not on file  Social Connections: Not on file     Family  History: The patient's family history includes Alcohol abuse in his father; Arthritis in his brother, father, and maternal grandfather; Cancer in his father and another family member; Depression in his mother and sister; Diabetes in his mother and another family member; Drug abuse in his sister; Early death in his sister; Heart attack in his brother, father, and maternal grandmother; Heart disease in his sister; Hyperlipidemia in his sister; Hypertension in his sister and another family member; Kidney disease in his mother; Miscarriages / [place] in his mother. There is no history of Colon cancer, Colon polyps, Esophageal cancer, Stomach cancer, or Rectal cancer.  ROS:   Please see the history of present illness.    All other systems reviewed and are negative.  EKGs/Labs/Other Studies Reviewed:    The following studies were reviewed today: .Aaron Aas   I discussed my findings with the patient at length   Recent Labs: 04/20/2023: Hemoglobin 14.6; Platelets 114.0 07/01/2023: BUN 14; Creatinine, Ser 0.85; Potassium 4.3; Sodium 143 11/09/2023: ALT 50; TSH 10.73  Recent Lipid Panel    Component Value Date/Time   CHOL 127 07/01/2023 0839   TRIG 95 07/01/2023 0839   HDL 45 07/01/2023 0839   CHOLHDL 2.8 07/01/2023 0839   CHOLHDL 4 10/18/2022 0916   VLDL 32.8 10/18/2022 0916   LDLCALC 64 07/01/2023 0839    Physical Exam:    VS:   BP 128/78   Pulse 60   Ht 5\' 11"  (1.803 m)   Wt (!) 305 lb 1.3 oz (138.4 kg)   SpO2 96%   BMI 42.55 kg/m     Wt Readings from Last 3 Encounters:  03/30/24 (!) 305 lb 1.3 oz (138.4 kg)  11/09/23 (!) 302 lb 6.4 oz (137.2 kg)  07/01/23 298 lb 1.9 oz (135.2 kg)     GEN: Patient is in no acute distress HEENT: Normal NECK: No JVD; No carotid bruits LYMPHATICS: No lymphadenopathy CARDIAC: Hear sounds regular, 2/6 systolic murmur at the apex. RESPIRATORY:  Clear to auscultation without rales, wheezing or rhonchi  ABDOMEN: Soft, non-tender, non-distended MUSCULOSKELETAL:  No edema; No deformity  SKIN: Warm and dry NEUROLOGIC:  Alert and oriented x 3 PSYCHIATRIC:  Normal affect   Signed, Nelia Balzarine, MD  03/30/2024 2:11 PM    [facility]

## 2024-04-03 ENCOUNTER — Encounter (HOSPITAL_COMMUNITY): Payer: Self-pay

## 2024-04-06 ENCOUNTER — Telehealth (HOSPITAL_COMMUNITY): Payer: Self-pay | Admitting: *Deleted

## 2024-04-06 ENCOUNTER — Other Ambulatory Visit: Payer: Self-pay | Admitting: Cardiology

## 2024-04-06 DIAGNOSIS — I10 Essential (primary) hypertension: Secondary | ICD-10-CM

## 2024-04-06 DIAGNOSIS — E119 Type 2 diabetes mellitus without complications: Secondary | ICD-10-CM

## 2024-04-06 DIAGNOSIS — I2510 Atherosclerotic heart disease of native coronary artery without angina pectoris: Secondary | ICD-10-CM

## 2024-04-06 DIAGNOSIS — Z9884 Bariatric surgery status: Secondary | ICD-10-CM

## 2024-04-06 DIAGNOSIS — G4733 Obstructive sleep apnea (adult) (pediatric): Secondary | ICD-10-CM

## 2024-04-06 NOTE — Telephone Encounter (Signed)
 Pt given instructions for MPI study.

## 2024-04-12 ENCOUNTER — Ambulatory Visit (HOSPITAL_COMMUNITY): Attending: Cardiovascular Disease

## 2024-04-12 DIAGNOSIS — G4733 Obstructive sleep apnea (adult) (pediatric): Secondary | ICD-10-CM | POA: Diagnosis not present

## 2024-04-12 DIAGNOSIS — I2510 Atherosclerotic heart disease of native coronary artery without angina pectoris: Secondary | ICD-10-CM | POA: Diagnosis not present

## 2024-04-12 DIAGNOSIS — Z9884 Bariatric surgery status: Secondary | ICD-10-CM | POA: Diagnosis not present

## 2024-04-12 DIAGNOSIS — I10 Essential (primary) hypertension: Secondary | ICD-10-CM | POA: Insufficient documentation

## 2024-04-12 DIAGNOSIS — E119 Type 2 diabetes mellitus without complications: Secondary | ICD-10-CM | POA: Insufficient documentation

## 2024-04-12 DIAGNOSIS — I700 Atherosclerosis of aorta: Secondary | ICD-10-CM | POA: Diagnosis not present

## 2024-04-12 LAB — MYOCARDIAL PERFUSION IMAGING
Angina Index: 0
Duke Treadmill Score: 5
Estimated workload: 7
Exercise duration (min): 5 min
Exercise duration (sec): 0 s
LV dias vol: 88 mL (ref 62–150)
LV sys vol: 21 mL
MPHR: 153 {beats}/min
Nuc Stress EF: 76 %
Peak HR: 162 {beats}/min
Percent HR: 105 %
Rest HR: 63 {beats}/min
Rest Nuclear Isotope Dose: 12.1 mCi
SDS: 0
SRS: 8
SSS: 6
ST Depression (mm): 0 mm
Stress Nuclear Isotope Dose: 36.4 mCi
TID: 1.02

## 2024-04-12 MED ORDER — TECHNETIUM TC 99M TETROFOSMIN IV KIT
12.1000 | PACK | Freq: Once | INTRAVENOUS | Status: AC | PRN
  Administered 2024-04-12: 12.1 via INTRAVENOUS

## 2024-04-12 MED ORDER — TECHNETIUM TC 99M TETROFOSMIN IV KIT
36.4000 | PACK | Freq: Once | INTRAVENOUS | Status: AC | PRN
  Administered 2024-04-12: 36.4 via INTRAVENOUS

## 2024-04-13 ENCOUNTER — Encounter (HOSPITAL_COMMUNITY)

## 2024-04-13 ENCOUNTER — Ambulatory Visit (HOSPITAL_COMMUNITY)

## 2024-04-20 ENCOUNTER — Ambulatory Visit: Payer: Self-pay | Admitting: Cardiology

## 2024-04-23 ENCOUNTER — Ambulatory Visit: Payer: Self-pay | Admitting: Cardiology

## 2024-05-05 ENCOUNTER — Other Ambulatory Visit: Payer: Self-pay | Admitting: Family Medicine

## 2024-05-05 DIAGNOSIS — Z13220 Encounter for screening for lipoid disorders: Secondary | ICD-10-CM

## 2024-05-05 DIAGNOSIS — I252 Old myocardial infarction: Secondary | ICD-10-CM

## 2024-05-22 ENCOUNTER — Ambulatory Visit (HOSPITAL_BASED_OUTPATIENT_CLINIC_OR_DEPARTMENT_OTHER)
Admission: RE | Admit: 2024-05-22 | Discharge: 2024-05-22 | Disposition: A | Discharge status: Home / Self Care | Source: Ambulatory Visit | Attending: Family Medicine | Admitting: Family Medicine

## 2024-05-22 DIAGNOSIS — K769 Liver disease, unspecified: Secondary | ICD-10-CM | POA: Diagnosis not present

## 2024-05-22 DIAGNOSIS — K760 Fatty (change of) liver, not elsewhere classified: Secondary | ICD-10-CM | POA: Diagnosis not present

## 2024-05-24 ENCOUNTER — Encounter: Payer: Self-pay | Admitting: Family Medicine

## 2024-06-13 ENCOUNTER — Other Ambulatory Visit: Payer: Self-pay | Admitting: Family Medicine

## 2024-06-13 DIAGNOSIS — I252 Old myocardial infarction: Secondary | ICD-10-CM

## 2024-06-13 DIAGNOSIS — Z13220 Encounter for screening for lipoid disorders: Secondary | ICD-10-CM

## 2024-06-21 MED ORDER — ATORVASTATIN CALCIUM 40 MG PO TABS
40.0000 mg | ORAL_TABLET | Freq: Every day | ORAL | 0 refills | Status: DC
Start: 2024-06-21 — End: 2024-07-02

## 2024-06-21 MED ORDER — RAMIPRIL 2.5 MG PO CAPS: 2.5000 mg | ORAL_CAPSULE | Freq: Every day | ORAL | 0 refills | Status: DC

## 2024-06-21 NOTE — Telephone Encounter (Signed)
 Refill sent for one month.  Copied from CRM [id number]. Topic: Clinical - Medication Question >> Jun 21, 2024 10:09 AM Chasity T wrote: Reason for CRM: Patient is calling in to request medication refill for ramipril  (ALTACE ) 2.5 MG capsule and atorvastatin  (LIPITOR) 40 MG tablet. Appointment was made for f/u on medication but he states he is completely out of them until the appointment day and wants to know if he can get a temp refill until the appointment. Please contact him back regarding medications.

## 2024-06-29 NOTE — Patient Instructions (Addendum)
 It was great to see you again today Recommend flu and COVID boosters this fall Please do get your colonoscopy set up if not already arranged- I reached out to Dr C for you  Will be in touch with your labs and I am glad to try and get you set up with Mounjaro    If all is well please see me in about 6 months

## 2024-06-29 NOTE — Progress Notes (Signed)
 [facility] [address], [address] [place], [place] [address] [phone]  Date:  07/02/2024   Name:  Scott Bauer   DOB:  1956/08/08   MRN:  [id number]  PCP:  Watt Harlene BROCKS, MD    Chief Complaint: Follow-up   History of Present Illness:  Scott Bauer is a 68 y.o. very pleasant male patient who presents with the following:  Patient seen today for medication follow-up.  I last saw him in December at which time we were trying to get him set up with the patient assistance program for Mounjaro   History of MI, HTN, OSA, hyperlipidemia, DM, hypothymism, idiopathic thrombocytopenia S/p PCI in 2008 and 2010 He had gastric sleeve surgery in 2018 History of significant smoking Pulmonology is managing his sleep apnea Dr. Edwyna is his cardiologist No CP or SOB   He saw Dr. Edwyna in May and completed a Myoview  5/22 Coronary artery disease: Secondary prevention stressed with the patient.  Importance of compliance with diet medication stressed any vocalized understanding.  He has some dyspnea on exertion.  It has been a long time since he had some evaluation.  He leads a sedentary lifestyle.  He discussed stress testing and we will set him up for an exercise stress Cardiolite. Essential hypertension: Blood pressure is stable and diet was emphasized. Mixed dyslipidemia, diabetes mellitus and morbid obesity: Lifestyle modification urged diet emphasized.  He promises to do better.  He will have complete blood work when he comes for the stress test.  He will also get hemoglobin A1c and vitamin D in view of history of deficiency. Patient will be seen in follow-up appointment in 9 months or earlier if the patient has any concerns. He did fine on his Myoview :   Findings are consistent with no ischemia. The study is low risk.   No ST deviation was noted. ECG was uninterpretable due to intraventricular conduction delay.   LV perfusion  is abnormal. Defect 1: There is a small defect with mild reduction in uptake present in the mid lateral location(s) that is fixed. There is normal wall motion in the defect area. Consistent with artifact.   Left ventricular function is normal. Nuclear stress EF: 76%. The left ventricular ejection fraction is hyperdynamic (>65%). End diastolic cavity size is normal. End systolic cavity size is normal.   CT images were obtained for attenuation correction and were examined for the presence of coronary calcium  when appropriate.   Coronary calcium  assessment not performed due to prior revascularization.   Prior study not available for comparison.   Small size, mild intensity fixed defect (SDS 0) of the mid lateral wall, which may represent scar or possibly artifact. No reversible ischemia. LVEF 76% with normal wall motion. Prior coronary intervention noted, probably in the LCX and possibly the LAD.  Overall low risk study. No prior for comparison.   He is on a 2-year follow-up plan for colonoscopy-it looks like this is overdue.  He is aware of this  I will reach out to Dr C to contact him for colon Needs urine micro  Lab Results  Component Value Date   HGBA1C 6.7 (H) 11/09/2023   Last you were worked up elevated alkaline phosphatase-patient reports this has been elevated for many years and he has had extensive workup.  We obtained a liver ultrasound earlier this year which was reassuring.  Plan for now is to monitor his liver testing  We tried  to get him Mounjaro  but it was too expensive - he is working on applying to an assistance program ?lilly direct   Patient Active Problem List   Diagnosis Date Noted   Colon polyp 10/08/2021   Diabetes mellitus without complication (HCC) 10/08/2021   Fatty liver 10/08/2021   Hypothyroidism 10/08/2021   Allergic rhinitis 10/08/2021   Idiopathic thrombocytopenia (HCC) 01/03/2019   History of MI (myocardial infarction) 01/03/2019   Erectile dysfunction  04/09/2018   History of right coronary artery stent placement 07/01/2017   H/O bariatric surgery 06/08/2017   Obesity, Class III, BMI 40-49.9 (morbid obesity) 05/30/2017   Type 2 diabetes mellitus with obesity (HCC) 05/30/2017   Essential hypertension 05/30/2017   OSA on CPAP 05/30/2017   Hypercholesterolemia 05/30/2017   Family history of abdominal aortic aneurysm (AAA) 12/16/2015   Testicular hypofunction 12/16/2015   Thrombocytopenia, unspecified (HCC) 12/16/2015   Coronary artery disease involving native coronary artery of native heart without angina pectoris 05/22/2015   Old MI (myocardial infarction) 05/22/2015   Coronary arteriosclerosis in native artery 05/22/2015   Multiple-type hyperlipidemia 05/22/2015    Past Medical History:  Diagnosis Date   Allergic rhinitis 10/08/2021   Colon polyp    Coronary arteriosclerosis in native artery 05/22/2015   Coronary artery disease involving native coronary artery of native heart without angina pectoris 05/22/2015   Diabetes mellitus without complication (HCC) 10/08/2021   type 2  on no meds     Erectile dysfunction 04/09/2018   Essential hypertension 05/30/2017   Family history of abdominal aortic aneurysm (AAA) 12/16/2015   Fatty liver    H/O bariatric surgery 06/08/2017   History of MI (myocardial infarction) 01/03/2019   History of right coronary artery stent placement 07/01/2017   Hypercholesterolemia 05/30/2017   Hypothyroidism    Idiopathic thrombocytopenia (HCC) 01/03/2019   Multiple-type hyperlipidemia 05/22/2015   Obesity, Class III, BMI 40-49.9 (morbid obesity) 05/30/2017   Old MI (myocardial infarction) 05/22/2015   OSA on CPAP 05/30/2017   Testicular hypofunction 12/16/2015   Thrombocytopenia, unspecified (HCC) 12/16/2015   Type 2 diabetes mellitus with obesity (HCC) 05/30/2017    Past Surgical History:  Procedure Laterality Date   ANAL FISSURE REPAIR     CARDIAC CATHETERIZATION     COLOSTOMY  2015    LAPAROSCOPIC GASTRIC SLEEVE RESECTION N/A 05/30/2017   Procedure: LAPAROSCOPIC GASTRIC SLEEVE RESECTION, UPPER ENDO;  Surgeon: Tanda Locus, MD;  Location: [facility];  Service: General;  Laterality: N/A;   NASAL SINUS SURGERY      Social History   Tobacco Use   Smoking status: Some Days    Current packs/day: 0.00    Types: Cigarettes, Cigars    Last attempt to quit: 2008    Years since quitting: 17.6   Smokeless tobacco: Never   Tobacco comments:    occasionally   Vaping Use   Vaping status: Never Used  Substance Use Topics   Alcohol use: Yes    Alcohol/week: 1.0 standard drink of alcohol    Types: 1 Cans of beer per week    Comment: beer occasionally , sometimes a mixed drink    Drug use: No    Comment: occasionally marijuana gummie out of town    Family History  Problem Relation Age of Onset   Cancer Other    Hypertension Other    Diabetes Other    Depression Mother    Diabetes Mother    Kidney disease Mother    Miscarriages / [place] Mother    Alcohol  abuse Father    Arthritis Father    Cancer Father    Heart attack Father    Heart disease Sister    Hyperlipidemia Sister    Hypertension Sister    Depression Sister    Drug abuse Sister    Early death Sister    Arthritis Brother    Heart attack Brother    Heart attack Maternal Grandmother    Arthritis Maternal Grandfather    Colon cancer Neg Hx    Colon polyps Neg Hx    Esophageal cancer Neg Hx    Stomach cancer Neg Hx    Rectal cancer Neg Hx     Allergies  Allergen Reactions   Cortisone    Prednisone  Nausea Only and Other (See Comments)    Makes him feel funny  Only has issues when takes dose packs    Medication list has been reviewed and updated.  Current Outpatient Medications on File Prior to Visit  Medication Sig Dispense Refill   aspirin EC 81 MG tablet Take 81 mg by mouth daily.     carvedilol  (COREG ) 6.25 MG tablet Take 1 tablet (6.25 mg total) by mouth 2 (two) times daily with a meal.  180 tablet 3   cetirizine (ZYRTEC) 10 MG tablet Take 10 mg by mouth daily.     Cholecalciferol (VITAMIN D-3) 5000 units TABS Take 5,000 Units by mouth every evening.     Coenzyme Q10 (COQ10) 100 MG CAPS Take 100 mg by mouth daily.     nitroGLYCERIN  (NITROSTAT ) 0.4 MG SL tablet Place 1 tablet (0.4 mg total) under the tongue every 5 (five) minutes as needed for chest pain. 25 tablet 6   UNABLE TO FIND Take 500 mg by mouth daily.  Mega Red 500 mg      UNABLE TO FIND Take 500 mg by mouth 3 (three) times daily. Med Name: Calcium  Citrate     UNABLE TO FIND Take 1 tablet by mouth daily.  Bariatric Advantage      vitamin E 400 UNIT capsule Take 400 Units by mouth daily.     No current facility-administered medications on file prior to visit.    Review of Systems:  As per HPI- otherwise negative.   Physical Examination: Vitals:   07/02/24 1257  BP: 134/72  Pulse: (!) 56  SpO2: 95%   Vitals:   07/02/24 1257  Weight: (!) 306 lb 9.6 oz (139.1 kg)  Height: 5' 11 (1.803 m)   Body mass index is 42.76 kg/m. Ideal Body Weight: Weight in (lb) to have BMI = 25: 178.9  GEN: no acute distress. Obese, looks well  HEENT: Atraumatic, Normocephalic.  Bilateral TM wnl, oropharynx normal.  PEERL,EOMI.   Ears and Nose: No external deformity. CV: RRR, No M/G/R. No JVD. No thrill. No extra heart sounds. PULM: CTA B, no wheezes, crackles, rhonchi. No retractions. No resp. distress. No accessory muscle use. ABD: S, NT, ND, +BS. No rebound. No HSM. EXTR: No c/c/e PSYCH: Normally interactive. Conversant.    Assessment and Plan: Postablative hypothyroidism - Plan: TSH, levothyroxine  (SYNTHROID ) 125 MCG tablet  Elevated alkaline phosphatase level - Plan: Comprehensive metabolic panel with GFR  Type 2 diabetes mellitus with obesity (HCC) - Plan: Comprehensive metabolic panel with GFR, Hemoglobin A1c, Microalbumin / creatinine urine ratio, metFORMIN  (GLUCOPHAGE ) 500 MG tablet  Morbid obesity  (HCC)  Coronary artery disease involving native coronary artery of native heart without angina pectoris - Plan: CBC, Comprehensive metabolic panel with GFR  Screening for  hyperlipidemia - Plan: Lipid panel, atorvastatin  (LIPITOR) 40 MG tablet  Special screening, prostate cancer - Plan: PSA  Old MI (myocardial infarction) - Plan: ramipril  (ALTACE ) 2.5 MG capsule  Screening for lung cancer - Plan: CT CHEST LUNG CA SCREEN LOW DOSE W/O CM  Following up today Ordered lung cancer screening CT He is trying to get on a GLP-1 assistance program- he will let me know Recheck on TSH and other labs as above today   Signed Harlene Schroeder, MD  Received labs as below, message to patient  Results for orders placed or performed in visit on 07/02/24  CBC   Collection Time: 07/02/24  1:14 PM  Result Value Ref Range   WBC 6.3 4.0 - 10.5 K/uL   RBC 4.98 4.22 - 5.81 Mil/uL   Platelets 120.0 (L) 150.0 - 400.0 K/uL   Hemoglobin 14.8 13.0 - 17.0 g/dL   HCT 55.7 60.9 - 47.9 %   MCV 88.9 78.0 - 100.0 fl   MCHC 33.5 30.0 - 36.0 g/dL   RDW 86.2 88.4 - 84.4 %  Comprehensive metabolic panel with GFR   Collection Time: 07/02/24  1:14 PM  Result Value Ref Range   Sodium 135 135 - 145 mEq/L   Potassium 4.2 3.5 - 5.1 mEq/L   Chloride 99 96 - 112 mEq/L   CO2 29 19 - 32 mEq/L   Glucose, Bld 235 (H) 70 - 99 mg/dL   BUN 12 6 - 23 mg/dL   Creatinine, Ser 9.04 0.40 - 1.50 mg/dL   Total Bilirubin 0.7 0.2 - 1.2 mg/dL   Alkaline Phosphatase 125 (H) 39 - 117 U/L   AST 28 0 - 37 U/L   ALT 54 (H) 0 - 53 U/L   Total Protein 6.8 6.0 - 8.3 g/dL   Albumin 4.3 3.5 - 5.2 g/dL   GFR 17.02 >39.99 mL/min   Calcium  9.2 8.4 - 10.5 mg/dL  Hemoglobin J8r   Collection Time: 07/02/24  1:14 PM  Result Value Ref Range   Hgb A1c MFr Bld 10.2 (H) 4.6 - 6.5 %  Lipid panel   Collection Time: 07/02/24  1:14 PM  Result Value Ref Range   Cholesterol 148 0 - 200 mg/dL   Triglycerides 831.9 (H) 0.0 - 149.0 mg/dL   HDL 51.69  >60.99 mg/dL   VLDL 66.3 0.0 - 59.9 mg/dL   LDL Cholesterol 66 0 - 99 mg/dL   Total CHOL/HDL Ratio 3    NonHDL 99.70   TSH   Collection Time: 07/02/24  1:14 PM  Result Value Ref Range   TSH 5.49 0.35 - 5.50 uIU/mL  PSA   Collection Time: 07/02/24  1:14 PM  Result Value Ref Range   PSA 0.79 0.10 - 4.00 ng/mL  Microalbumin / creatinine urine ratio   Collection Time: 07/02/24  1:15 PM  Result Value Ref Range   Microalb, Ur 2.2 (H) 0.0 - 1.9 mg/dL   Creatinine,U 06.5 mg/dL   Microalb Creat Ratio 23.2 0.0 - 30.0 mg/g

## 2024-07-02 ENCOUNTER — Encounter: Payer: Self-pay | Admitting: Family Medicine

## 2024-07-02 ENCOUNTER — Ambulatory Visit: Admitting: Family Medicine

## 2024-07-02 VITALS — BP 134/72 | HR 56 | Ht 71.0 in | Wt 306.6 lb

## 2024-07-02 DIAGNOSIS — E1169 Type 2 diabetes mellitus with other specified complication: Secondary | ICD-10-CM | POA: Diagnosis not present

## 2024-07-02 DIAGNOSIS — E890 Postprocedural hypothyroidism: Secondary | ICD-10-CM

## 2024-07-02 DIAGNOSIS — I2510 Atherosclerotic heart disease of native coronary artery without angina pectoris: Secondary | ICD-10-CM | POA: Diagnosis not present

## 2024-07-02 DIAGNOSIS — Z125 Encounter for screening for malignant neoplasm of prostate: Secondary | ICD-10-CM

## 2024-07-02 DIAGNOSIS — R748 Abnormal levels of other serum enzymes: Secondary | ICD-10-CM | POA: Diagnosis not present

## 2024-07-02 DIAGNOSIS — I252 Old myocardial infarction: Secondary | ICD-10-CM

## 2024-07-02 DIAGNOSIS — Z13220 Encounter for screening for lipoid disorders: Secondary | ICD-10-CM | POA: Diagnosis not present

## 2024-07-02 DIAGNOSIS — Z122 Encounter for screening for malignant neoplasm of respiratory organs: Secondary | ICD-10-CM

## 2024-07-02 DIAGNOSIS — E669 Obesity, unspecified: Secondary | ICD-10-CM

## 2024-07-02 DIAGNOSIS — Z7984 Long term (current) use of oral hypoglycemic drugs: Secondary | ICD-10-CM

## 2024-07-02 LAB — COMPREHENSIVE METABOLIC PANEL WITH GFR
ALT: 54 U/L — ABNORMAL HIGH (ref 0–53)
AST: 28 U/L (ref 0–37)
Albumin: 4.3 g/dL (ref 3.5–5.2)
Alkaline Phosphatase: 125 U/L — ABNORMAL HIGH (ref 39–117)
BUN: 12 mg/dL (ref 6–23)
CO2: 29 meq/L (ref 19–32)
Calcium: 9.2 mg/dL (ref 8.4–10.5)
Chloride: 99 meq/L (ref 96–112)
Creatinine, Ser: 0.95 mg/dL (ref 0.40–1.50)
GFR: 82.97 mL/min (ref >60.00)
Glucose, Bld: 235 mg/dL — ABNORMAL HIGH (ref 70–99)
Potassium: 4.2 meq/L (ref 3.5–5.1)
Sodium: 135 meq/L (ref 135–145)
Total Bilirubin: 0.7 mg/dL (ref 0.2–1.2)
Total Protein: 6.8 g/dL (ref 6.0–8.3)

## 2024-07-02 LAB — CBC
HCT: 44.2 % (ref 39.0–52.0)
Hemoglobin: 14.8 g/dL (ref 13.0–17.0)
MCHC: 33.5 g/dL (ref 30.0–36.0)
MCV: 88.9 fl (ref 78.0–100.0)
Platelets: 120 K/uL — ABNORMAL LOW (ref 150.0–400.0)
RBC: 4.98 Mil/uL (ref 4.22–5.81)
RDW: 13.7 % (ref 11.5–15.5)
WBC: 6.3 K/uL (ref 4.0–10.5)

## 2024-07-02 LAB — MICROALBUMIN / CREATININE URINE RATIO
Creatinine,U: 93.4 mg/dL
Microalb Creat Ratio: 23.2 mg/g (ref 0.0–30.0)
Microalb, Ur: 2.2 mg/dL — ABNORMAL HIGH (ref 0.0–1.9)

## 2024-07-02 LAB — HEMOGLOBIN A1C: Hgb A1c MFr Bld: 10.2 % — ABNORMAL HIGH (ref 4.6–6.5)

## 2024-07-02 LAB — PSA: PSA: 0.79 ng/mL (ref 0.10–4.00)

## 2024-07-02 LAB — LIPID PANEL
Cholesterol: 148 mg/dL (ref 0–200)
HDL: 48.3 mg/dL (ref >39.00)
LDL Cholesterol: 66 mg/dL (ref 0–99)
NonHDL: 99.7
Total CHOL/HDL Ratio: 3
Triglycerides: 168 mg/dL — ABNORMAL HIGH (ref 0.0–149.0)
VLDL: 33.6 mg/dL (ref 0.0–40.0)

## 2024-07-02 LAB — TSH: TSH: 5.49 u[IU]/mL (ref 0.35–5.50)

## 2024-07-02 MED ORDER — METFORMIN HCL 500 MG PO TABS: 1000.0000 mg | ORAL_TABLET | Freq: Every day | ORAL | 3 refills | Status: AC

## 2024-07-02 MED ORDER — LEVOTHYROXINE SODIUM 125 MCG PO TABS: 125.0000 ug | ORAL_TABLET | Freq: Every day | ORAL | 3 refills | Status: AC

## 2024-07-02 MED ORDER — ATORVASTATIN CALCIUM 40 MG PO TABS: 40.0000 mg | ORAL_TABLET | Freq: Every day | ORAL | 3 refills | Status: AC

## 2024-07-02 MED ORDER — RAMIPRIL 2.5 MG PO CAPS: 2.5000 mg | ORAL_CAPSULE | Freq: Every day | ORAL | 3 refills | Status: AC

## 2024-07-05 ENCOUNTER — Telehealth: Payer: Self-pay

## 2024-07-05 NOTE — Telephone Encounter (Signed)
 Medication Samples have been provided to the patient.  Drug name: Mounjaro      Strength: 2.5 mg       Qty: 1 box  LOT: [id number] A Exp.Date: 11/01/2025  The patient has been instructed regarding the correct time, dose, and frequency of taking this medication, including desired effects and most common side effects.   Scott Bauer [facility] 9:14 AM 07/05/2024

## 2024-07-05 NOTE — Telephone Encounter (Signed)
 Yes we do! I will go ahead and get it ready for pt pick up.

## 2024-07-06 ENCOUNTER — Ambulatory Visit (HOSPITAL_BASED_OUTPATIENT_CLINIC_OR_DEPARTMENT_OTHER)
Admission: RE | Admit: 2024-07-06 | Discharge: 2024-07-06 | Disposition: A | Discharge status: Home / Self Care | Source: Ambulatory Visit | Attending: Family Medicine | Admitting: Family Medicine

## 2024-07-06 DIAGNOSIS — Z122 Encounter for screening for malignant neoplasm of respiratory organs: Secondary | ICD-10-CM | POA: Diagnosis not present

## 2024-07-06 DIAGNOSIS — F17210 Nicotine dependence, cigarettes, uncomplicated: Secondary | ICD-10-CM | POA: Diagnosis not present

## 2024-07-09 ENCOUNTER — Telehealth: Payer: Self-pay

## 2024-07-09 NOTE — Telephone Encounter (Signed)
Called patient and left detailed message to call back and schedule. 

## 2024-07-09 NOTE — Telephone Encounter (Signed)
-----   Message from Sandor LULLA Flatter sent at 07/09/2024 10:41 AM EDT ----- Walterine Guillaume, no problem, will get him all set up.  Team, his last colonoscopy was 12/2019 and 3 small 3-5 mm polyps (adenoma x 1, SSP x 2), fair prep, with recommendation to repeat in 2 years.  Can we please contact patient and schedule for repeat colonoscopy in the [facility].  Thanks.  VC ----- Message ----- From: Watt Harlene BROCKS, MD Sent: 07/02/2024   1:10 PM EDT To: Sandor Flatter LULLA, DO  Hi Jairo Gals needs to update his colon if you would be so kind as to have your staff reach out to him.  Thank you! Jess

## 2024-07-12 ENCOUNTER — Other Ambulatory Visit: Payer: Self-pay | Admitting: Cardiology

## 2024-07-12 NOTE — Telephone Encounter (Signed)
 Called and spoke with patient to schedule recall colonoscopy. Patient has been scheduled for telephone PV on Friday, 08/03/24 at 3:30 pm. Recall colonoscopy scheduled in the [facility] on Friday, 08/17/24 arriving at 2:30 pm. Patient verbalized understanding and had no concerns at the end of the call.

## 2024-07-21 ENCOUNTER — Encounter: Payer: Self-pay | Admitting: Family Medicine

## 2024-07-24 DIAGNOSIS — H1045 Other chronic allergic conjunctivitis: Secondary | ICD-10-CM | POA: Diagnosis not present

## 2024-07-24 DIAGNOSIS — J3089 Other allergic rhinitis: Secondary | ICD-10-CM | POA: Diagnosis not present

## 2024-07-24 DIAGNOSIS — J300 Vasomotor rhinitis: Secondary | ICD-10-CM | POA: Diagnosis not present

## 2024-07-30 DIAGNOSIS — Z9989 Dependence on other enabling machines and devices: Secondary | ICD-10-CM | POA: Diagnosis not present

## 2024-07-30 DIAGNOSIS — G4733 Obstructive sleep apnea (adult) (pediatric): Secondary | ICD-10-CM | POA: Diagnosis not present

## 2024-07-30 DIAGNOSIS — Z955 Presence of coronary angioplasty implant and graft: Secondary | ICD-10-CM | POA: Diagnosis not present

## 2024-08-03 ENCOUNTER — Ambulatory Visit (AMBULATORY_SURGERY_CENTER): Admitting: *Deleted

## 2024-08-03 VITALS — Ht 71.0 in | Wt 302.0 lb

## 2024-08-03 DIAGNOSIS — Z860100 Personal history of colon polyps, unspecified: Secondary | ICD-10-CM

## 2024-08-03 MED ORDER — NA SULFATE-K SULFATE-MG SULF 17.5-3.13-1.6 GM/177ML PO SOLN: 1.0000 | Freq: Once | ORAL | 0 refills | Status: AC

## 2024-08-03 NOTE — Progress Notes (Signed)
 Pt's name and DOB verified at the beginning of the pre-visit with 2 identifiers  Pt denies any difficulty with ambulating,sitting, laying down or rolling side to side  Pt has no issues moving head neck or swallowing  No egg or soy allergy known to patient   No issues known to pt with past sedation  No FH of Malignant Hyperthermia  Pt is not on home 02   Pt is not on blood thinners   Pt denies issues with constipation   Pt is not on dialysis  Pt denise any abnormal heart rhythms   Pt denies any upcoming cardiac testing  Chart not reviewed by CRNA prior to PV  Visit by phone  Pt states weight is 302 lb   Pt given  both [facility] main # and MD on call # prior to instructions.  Informed pt to come in at the time discussed and is shown on PV instructions.  Pt instructed to use Single[employer] or GoodRx for a price reduction on prep  Instructed pt where to find PV instructions in My Chart  Instructed pt on all aspects of written instructions including med holds clothing to wear and foods to eat and not eat as well as after procedure legal restrictions and to call MD on call if needed.. Pt states understanding. Instructed pt to review instructions again prior to procedure and call main # given if has any questions or any issues. Pt states they will.

## 2024-08-06 ENCOUNTER — Encounter: Payer: Self-pay | Admitting: Gastroenterology

## 2024-08-16 ENCOUNTER — Encounter: Payer: Self-pay | Admitting: Gastroenterology

## 2024-08-16 ENCOUNTER — Other Ambulatory Visit: Payer: Self-pay

## 2024-08-16 ENCOUNTER — Telehealth: Payer: Self-pay

## 2024-08-16 DIAGNOSIS — Z1211 Encounter for screening for malignant neoplasm of colon: Secondary | ICD-10-CM

## 2024-08-16 DIAGNOSIS — Z860100 Personal history of colon polyps, unspecified: Secondary | ICD-10-CM

## 2024-08-16 NOTE — Telephone Encounter (Signed)
 Procedure cancelled & pt made aware. He's been advised we will contact him with new dates soon.

## 2024-08-16 NOTE — Telephone Encounter (Signed)
 Left message for patient to call back.   Next hospital dates currently available: 10/23; 11/4; 12/2; 12/30

## 2024-08-16 NOTE — Telephone Encounter (Signed)
 Spoke with patient & 11/4 is a date that works well for him. Rescheduled colon to [facility] on 11/4 at 10:30 AM with Dr. San. Updated instructions including 2 day prep sent to patient on mychart as patient requested.

## 2024-08-16 NOTE — Telephone Encounter (Signed)
 Cirigliano, Vito V, DO  Banks, [place], RN; Sonterra, Nat DEL, [place] This patient is scheduled for a colonoscopy with me tomorrow, 9/26.  Just talked with anesthesia here and unfortunately has a history of difficult airway.  Patient needs to be rescheduled for the hospital.  Additionally, looks like he was a fair prep last time, and should be an extended 2-day prep.  Can you please contact the patient to let him know that tomorrow's procedure should be canceled and rescheduled to the hospital and we can look for dates for that.  Thanks.

## 2024-08-17 ENCOUNTER — Encounter: Admitting: Gastroenterology

## 2024-08-28 ENCOUNTER — Encounter: Payer: Self-pay | Admitting: Family Medicine

## 2024-08-28 DIAGNOSIS — E119 Type 2 diabetes mellitus without complications: Secondary | ICD-10-CM

## 2024-08-30 MED ORDER — TIRZEPATIDE 2.5 MG/0.5ML ~~LOC~~ SOAJ: 2.5000 mg | SUBCUTANEOUS | 1 refills | Status: DC

## 2024-08-30 NOTE — Addendum Note (Signed)
 Addended by: WATT RAISIN C on: 08/30/2024 07:43 PM   Modules accepted: Orders

## 2024-08-30 NOTE — Addendum Note (Signed)
 Addended by: WATT RAISIN C on: 08/30/2024 12:10 PM   Modules accepted: Orders

## 2024-09-03 ENCOUNTER — Other Ambulatory Visit: Payer: Self-pay | Admitting: Cardiology

## 2024-09-13 DIAGNOSIS — G4733 Obstructive sleep apnea (adult) (pediatric): Secondary | ICD-10-CM | POA: Diagnosis not present

## 2024-09-14 ENCOUNTER — Encounter: Payer: Self-pay | Admitting: Gastroenterology

## 2024-09-14 LAB — HM DIABETES EYE EXAM

## 2024-09-19 ENCOUNTER — Encounter (HOSPITAL_COMMUNITY): Payer: Self-pay | Admitting: Gastroenterology

## 2024-09-20 ENCOUNTER — Telehealth: Payer: Self-pay

## 2024-09-20 NOTE — Telephone Encounter (Signed)
 Procedure:COLON Procedure date: 09/25/24 Procedure location: [facility] Arrival Time: 8:30 Spoke with the patient Y/N: Y Any prep concerns? N  Has the patient obtained the prep from the pharmacy ? Y Do you have a care partner and transportation: Y Any additional concerns? N

## 2024-09-25 ENCOUNTER — Ambulatory Visit (HOSPITAL_COMMUNITY)
Admission: RE | Admit: 2024-09-25 | Discharge: 2024-09-25 | Disposition: A | Discharge status: Home / Self Care | Attending: Gastroenterology | Admitting: Gastroenterology

## 2024-09-25 ENCOUNTER — Encounter (HOSPITAL_COMMUNITY): Payer: Self-pay | Admitting: Gastroenterology

## 2024-09-25 ENCOUNTER — Other Ambulatory Visit: Payer: Self-pay

## 2024-09-25 ENCOUNTER — Ambulatory Visit (HOSPITAL_COMMUNITY): Admitting: Anesthesiology

## 2024-09-25 ENCOUNTER — Encounter (HOSPITAL_COMMUNITY)
Admission: RE | Disposition: A | Discharge status: Home / Self Care | Payer: Self-pay | Source: Home / Self Care | Attending: Gastroenterology

## 2024-09-25 DIAGNOSIS — K648 Other hemorrhoids: Secondary | ICD-10-CM | POA: Insufficient documentation

## 2024-09-25 DIAGNOSIS — F17290 Nicotine dependence, other tobacco product, uncomplicated: Secondary | ICD-10-CM | POA: Insufficient documentation

## 2024-09-25 DIAGNOSIS — Z7984 Long term (current) use of oral hypoglycemic drugs: Secondary | ICD-10-CM | POA: Insufficient documentation

## 2024-09-25 DIAGNOSIS — G4733 Obstructive sleep apnea (adult) (pediatric): Secondary | ICD-10-CM | POA: Insufficient documentation

## 2024-09-25 DIAGNOSIS — E1169 Type 2 diabetes mellitus with other specified complication: Secondary | ICD-10-CM | POA: Diagnosis not present

## 2024-09-25 DIAGNOSIS — Z09 Encounter for follow-up examination after completed treatment for conditions other than malignant neoplasm: Secondary | ICD-10-CM | POA: Insufficient documentation

## 2024-09-25 DIAGNOSIS — J449 Chronic obstructive pulmonary disease, unspecified: Secondary | ICD-10-CM | POA: Diagnosis not present

## 2024-09-25 DIAGNOSIS — Z860101 Personal history of adenomatous and serrated colon polyps: Secondary | ICD-10-CM | POA: Diagnosis not present

## 2024-09-25 DIAGNOSIS — Z79899 Other long term (current) drug therapy: Secondary | ICD-10-CM | POA: Insufficient documentation

## 2024-09-25 DIAGNOSIS — Z955 Presence of coronary angioplasty implant and graft: Secondary | ICD-10-CM | POA: Insufficient documentation

## 2024-09-25 DIAGNOSIS — Z1211 Encounter for screening for malignant neoplasm of colon: Secondary | ICD-10-CM | POA: Diagnosis not present

## 2024-09-25 DIAGNOSIS — Z9884 Bariatric surgery status: Secondary | ICD-10-CM | POA: Diagnosis not present

## 2024-09-25 DIAGNOSIS — K641 Second degree hemorrhoids: Secondary | ICD-10-CM

## 2024-09-25 DIAGNOSIS — I2510 Atherosclerotic heart disease of native coronary artery without angina pectoris: Secondary | ICD-10-CM | POA: Diagnosis not present

## 2024-09-25 DIAGNOSIS — Z7985 Long-term (current) use of injectable non-insulin antidiabetic drugs: Secondary | ICD-10-CM | POA: Insufficient documentation

## 2024-09-25 DIAGNOSIS — Z6841 Body Mass Index (BMI) 40.0 and over, adult: Secondary | ICD-10-CM | POA: Insufficient documentation

## 2024-09-25 DIAGNOSIS — I10 Essential (primary) hypertension: Secondary | ICD-10-CM | POA: Insufficient documentation

## 2024-09-25 DIAGNOSIS — I252 Old myocardial infarction: Secondary | ICD-10-CM | POA: Diagnosis not present

## 2024-09-25 DIAGNOSIS — K5730 Diverticulosis of large intestine without perforation or abscess without bleeding: Secondary | ICD-10-CM | POA: Insufficient documentation

## 2024-09-25 DIAGNOSIS — Z860100 Personal history of colon polyps, unspecified: Secondary | ICD-10-CM

## 2024-09-25 DIAGNOSIS — E039 Hypothyroidism, unspecified: Secondary | ICD-10-CM | POA: Diagnosis not present

## 2024-09-25 HISTORY — PX: COLONOSCOPY: SHX5424

## 2024-09-25 LAB — GLUCOSE, CAPILLARY: Glucose-Capillary: 326 mg/dL — ABNORMAL HIGH (ref 70–99)

## 2024-09-25 SURGERY — COLONOSCOPY
Anesthesia: Monitor Anesthesia Care

## 2024-09-25 MED ORDER — SODIUM CHLORIDE 0.9 % IV SOLN: INTRAVENOUS | Status: DC

## 2024-09-25 MED ORDER — PROPOFOL 1000 MG/100ML IV EMUL
INTRAVENOUS | Status: AC
  Filled 2024-09-25: qty 100

## 2024-09-25 MED ORDER — SODIUM CHLORIDE 0.9 % IV SOLN
INTRAVENOUS | Status: AC | PRN
  Administered 2024-09-25: 500 mL via INTRAMUSCULAR

## 2024-09-25 MED ORDER — PROPOFOL 500 MG/50ML IV EMUL
INTRAVENOUS | Status: DC | PRN
  Administered 2024-09-25: 150 ug/kg/min via INTRAVENOUS

## 2024-09-25 NOTE — H&P (Addendum)
 GASTROENTEROLOGY PROCEDURE H&P NOTE   Primary Care Physician: Watt Harlene BROCKS, MD    Reason for Procedure:  Colon polyp surveillance  Plan:    Colonoscopy   Patient is appropriate for endoscopic procedure(s) at [facility].  The nature of the procedure, as well as the risks, benefits, and alternatives were carefully and thoroughly reviewed with the patient. Ample time for discussion and questions allowed. The patient understood, was satisfied, and agreed to proceed.     HPI: Scott Bauer is a 68 y.o. male who presents for colonoscopy for ongoing polyp surveillance.  Last colonoscopy was 12/2019 and notable for 3 small 3-5 mm polyps (adenoma x 1, SSP x 2) along with fair prep with recommendation to repeat in 2 years.  Otherwise no recent GI symptoms.  Completed extended 2-day prep for procedure today due to history of fair prep.  Due to history of difficult airway, along with history of severe OSA on CPAP, CAD, morbid obesity, procedure scheduled at [facility] due to elevated periprocedural risks.  Abdominal ultrasound in 05/2024 with hepatic steatosis, but otherwise no acute intra-abdominal/GI pathology.  Most recent labs reviewed from 06/2024 to include CBC and CMP as below.     Latest Ref Rng & Units 07/02/2024    1:14 PM 04/20/2023    8:45 AM 10/18/2022    9:16 AM  CBC  WBC 4.0 - 10.5 K/uL 6.3  5.2  6.3   Hemoglobin 13.0 - 17.0 g/dL 85.1  85.3  86.0   Hematocrit 39.0 - 52.0 % 44.2  43.1  40.3   Platelets 150.0 - 400.0 K/uL 120.0  114.0  131.0       Latest Ref Rng & Units 07/02/2024    1:14 PM 11/09/2023    8:38 AM 07/01/2023    8:39 AM  CMP  Glucose 70 - 99 mg/dL 764   865   BUN 6 - 23 mg/dL 12   14   Creatinine 9.59 - 1.50 mg/dL 9.04   9.14   Sodium 864 - 145 mEq/L 135   143   Potassium 3.5 - 5.1 mEq/L 4.2   4.3   Chloride 96 - 112 mEq/L 99   105   CO2 19 - 32 mEq/L 29   25   Calcium  8.4 - 10.5 mg/dL 9.2   9.1   Total  Protein 6.0 - 8.3 g/dL 6.8  6.7  6.1   Total Bilirubin 0.2 - 1.2 mg/dL 0.7  0.7  0.4   Alkaline Phos 39 - 117 U/L 125  133  148   AST 0 - 37 U/L 28  27  28    ALT 0 - 53 U/L 54  50  43      Past Medical History:  Diagnosis Date   Allergic rhinitis 10/08/2021   Colon polyp    Coronary arteriosclerosis in native artery 05/22/2015   Coronary artery disease involving native coronary artery of native heart without angina pectoris 05/22/2015   Diabetes mellitus without complication (HCC) 10/08/2021   type 2  on no meds     Difficult airway for intubation 05/30/2017   Difficult airway - due to large tongue, Difficult airway - due to limited oral opening; Grade 1; 4; 7.5 mm; Cuffed, Min.occ.pres.; 2; Stylet, Video Laryngoscope; 22 cm   Erectile dysfunction 04/09/2018   Essential hypertension 05/30/2017   Family history of abdominal aortic aneurysm (AAA) 12/16/2015   Fatty liver    H/O bariatric surgery  06/08/2017   History of MI (myocardial infarction) 01/03/2019   History of right coronary artery stent placement 07/01/2017   Hypercholesterolemia 05/30/2017   Hypothyroidism    Idiopathic thrombocytopenia (HCC) 01/03/2019   Multiple-type hyperlipidemia 05/22/2015   Obesity, Class III, BMI 40-49.9 (morbid obesity) (HCC) 05/30/2017   Old MI (myocardial infarction) 05/22/2015   OSA on CPAP 05/30/2017   Sleep apnea    Testicular hypofunction 12/16/2015   Thrombocytopenia    Thrombocytopenia, unspecified 12/16/2015   Type 2 diabetes mellitus with obesity 05/30/2017    Past Surgical History:  Procedure Laterality Date   ANAL FISSURE REPAIR     CARDIAC CATHETERIZATION     COLONOSCOPY     COLOSTOMY  2015   LAPAROSCOPIC GASTRIC SLEEVE RESECTION N/A 05/30/2017   Procedure: LAPAROSCOPIC GASTRIC SLEEVE RESECTION, UPPER ENDO;  Surgeon: Tanda Locus, MD;  Location: [facility];  Service: General;  Laterality: N/A;   NASAL SINUS SURGERY      Prior to Admission medications   Medication Sig Start  Date End Date Taking? Authorizing Provider  aspirin EC 81 MG tablet Take 81 mg by mouth daily.    [provider]  atorvastatin  (LIPITOR) 40 MG tablet Take 1 tablet (40 mg total) by mouth daily. Needs appt 07/02/24   Copland, Harlene BROCKS, MD  carvedilol  (COREG ) 6.25 MG tablet Take 1 tablet (6.25 mg total) by mouth 2 (two) times daily with a meal. 07/12/24   Revankar, Jennifer SAUNDERS, MD  cetirizine (ZYRTEC) 10 MG tablet Take 10 mg by mouth daily. 12/04/18   [provider]  Cholecalciferol (VITAMIN D-3) 5000 units TABS Take 5,000 Units by mouth every evening.    [provider]  Coenzyme Q10 (COQ10) 100 MG CAPS Take 100 mg by mouth daily.    [provider]  ipratropium (ATROVENT) 0.06 % nasal spray 1-2 sprays in each nostril Nasally Three times a day prn breakthrough rhinitis symptoms; Duration: 30 days 07/10/24   [provider]  levothyroxine  (SYNTHROID ) 125 MCG tablet Take 1 tablet (125 mcg total) by mouth daily. 07/02/24   Copland, Harlene BROCKS, MD  metFORMIN  (GLUCOPHAGE ) 500 MG tablet Take 2 tablets (1,000 mg total) by mouth daily. Take one tablet once daily Patient taking differently: Take 500 mg by mouth daily. Take one tablet once daily 07/02/24   Copland, Jessica C, MD  nitroGLYCERIN  (NITROSTAT ) 0.4 MG SL tablet PLACE 1 TABLET UNDER THE TONGUE EVERY 5 MINUTES AS NEEDED FOR CHEST PAIN 09/03/24   Revankar, Jennifer SAUNDERS, MD  ramipril  (ALTACE ) 2.5 MG capsule Take 1 capsule (2.5 mg total) by mouth daily. Needs appt 07/02/24   Copland, Harlene BROCKS, MD  tirzepatide  (MOUNJARO ) 2.5 MG/0.5ML Pen Inject 2.5 mg into the skin once a week. 08/30/24   Copland, Harlene BROCKS, MD  UNABLE TO FIND Take 500 mg by mouth daily.  Mega Red 500 mg     [provider]  UNABLE TO FIND Take 500 mg by mouth 3 (three) times daily. Med Name: Calcium  Citrate    [provider]  UNABLE TO FIND Take 1 tablet by mouth daily.  Bariatric Advantage     [provider]  vitamin E 400 UNIT  capsule Take 400 Units by mouth daily.    [provider]    Current Facility-Administered Medications  Medication Dose Route Frequency Provider Last Rate Last Admin   0.9 %  sodium chloride  infusion   Intravenous Continuous Ranisha Allaire V, DO        Allergies as  of 08/16/2024 - Review Complete 08/03/2024  Allergen Reaction Noted   Cortisone  05/22/2015   Prednisone  Nausea Only and Other (See Comments) 05/24/2016    Family History  Problem Relation Age of Onset   Cancer Other    Hypertension Other    Diabetes Other    Depression Mother    Diabetes Mother    Kidney disease Mother    Miscarriages / Stillbirths Mother    Alcohol abuse Father    Arthritis Father    Cancer Father    Heart attack Father    Heart disease Sister    Hyperlipidemia Sister    Hypertension Sister    Depression Sister    Drug abuse Sister    Early death Sister    Arthritis Brother    Heart attack Brother    Heart attack Maternal Grandmother    Arthritis Maternal Grandfather    Colon cancer Neg Hx    Colon polyps Neg Hx    Esophageal cancer Neg Hx    Stomach cancer Neg Hx    Rectal cancer Neg Hx     Social History   Socioeconomic History   Marital status: Married    Spouse name: Not on file   Number of children: Not on file   Years of education: Not on file   Highest education level: Not on file  Occupational History   Not on file  Tobacco Use   Smoking status: Some Days    Types: Cigars   Smokeless tobacco: Never   Tobacco comments:    occasionally   Vaping Use   Vaping status: Never Used  Substance and Sexual Activity   Alcohol use: Yes    Alcohol/week: 1.0 standard drink of alcohol    Types: 1 Cans of beer per week    Comment: beer occasionally , sometimes a mixed drink    Drug use: No    Comment: occasionally marijuana gummie out of town   Sexual activity: Not on file  Other Topics Concern   Not on file  Social History Narrative   Not on file   Social  Drivers of Health   Financial Resource Strain: Not on file  Food Insecurity: Low Risk  (07/30/2024)   Received from [facility]   Hunger Vital Sign    Within the past 12 months, you worried that your food would run out before you got money to buy more: Never true    Within the past 12 months, the food you bought just didn't last and you didn't have money to get more. : Never true  Transportation Needs: No Transportation Needs (07/30/2024)   Received from [facility]    In the past 12 months, has lack of reliable transportation kept you from medical appointments, meetings, work or from getting things needed for daily living? : No  Physical Activity: Not on file  Stress: Not on file  Social Connections: Not on file  Intimate Partner Violence: Not on file    Physical Exam: Vital signs in last 24 hours: @There  were no vitals taken for this visit. GEN: NAD EYE: Sclerae anicteric ENT: MMM CV: Non-tachycardic Pulm: CTA b/l GI: Soft, NT/ND NEURO:  Alert & Oriented x 3   Sandor Flatter, DO [facility]   09/25/2024 8:56 AM

## 2024-09-25 NOTE — Discharge Instructions (Signed)

## 2024-09-25 NOTE — Transfer of Care (Signed)
 Immediate Anesthesia Transfer of Care Note  Patient: Scott Bauer  Procedure(s) Performed: COLONOSCOPY  Patient Location: PACU  Anesthesia Type:MAC  Level of Consciousness: sedated  Airway & Oxygen Therapy: Patient Spontanous Breathing and Patient connected to face mask oxygen  Post-op Assessment: Report given to RN and Post -op Vital signs reviewed and stable  Post vital signs: Reviewed and stable  Last Vitals:  Vitals Value Taken Time  BP 122/63 09/25/24 10:20  Temp 36.3 C 09/25/24 10:20  Pulse 73 09/25/24 10:23  Resp 14 09/25/24 10:23  SpO2 98 % 09/25/24 10:23  Vitals shown include unfiled device data.  Last Pain:  Vitals:   09/25/24 1020  TempSrc: Temporal  PainSc:          Complications: No notable events documented.

## 2024-09-25 NOTE — Anesthesia Preprocedure Evaluation (Addendum)
 Anesthesia Evaluation  Patient identified by MRN, date of birth, ID band Patient awake    Reviewed: Allergy & Precautions, NPO status , Patient's Chart, lab work & pertinent test results, reviewed documented beta blocker date and time   History of Anesthesia Complications (+) DIFFICULT AIRWAY and history of anesthetic complications (2018, OK with VideoScope)  Airway Mallampati: III  TM Distance: >3 FB Neck ROM: Full    Dental  (+) Dental Advisory Given   Pulmonary sleep apnea and Continuous Positive Airway Pressure Ventilation , COPD,  COPD inhaler, Current Smoker and Patient abstained from smoking.   breath sounds clear to auscultation       Cardiovascular hypertension, Pt. on medications and Pt. on home beta blockers (-) angina + CAD, + Past MI and + Cardiac Stents   Rhythm:Regular Rate:Normal  03/2024 Stress:  Small size, mild intensity fixed defect (SDS 0) of the mid lateral wall, which may represent scar or possibly artifact. No reversible ischemia. LVEF 76% with normal wall motion. Prior coronary intervention noted, probably in the LCX and possibly the LAD.  Overall low risk study.   '24 ECHO: EF 60 to 65%.  1.The LV has normal function, no regional wall motion abnormalities. Grade I diastolic dysfunction (impaired relaxation).   2. RVF is normal. The right ventricular size is normal.   3. The mitral valve is degenerative. Mild mitral valve regurgitation. No evidence of mitral stenosis. Moderate mitral annular calcification.   4. The aortic valve is tricuspid. There is mild calcification of the aortic valve. There is mild thickening of the aortic valve. Aortic valve regurgitation is not visualized. Aortic valve sclerosis is present, with no evidence of aortic valve stenosis.      Neuro/Psych    GI/Hepatic Neg liver ROS,GERD  ,,S/p gastric sleeve   Endo/Other  diabetesHypothyroidism  Mounjaro  BMI 42  Renal/GU negative  Renal ROS     Musculoskeletal   Abdominal   Peds  Hematology   Anesthesia Other Findings   Reproductive/Obstetrics                              Anesthesia Physical Anesthesia Plan  ASA: 3  Anesthesia Plan: MAC   Post-op Pain Management: Minimal or no pain anticipated   Induction:   PONV Risk Score and Plan: Treatment may vary due to age or medical condition  Airway Management Planned: Natural Airway and Simple Face Mask  Additional Equipment: None  Intra-op Plan:   Post-operative Plan:   Informed Consent: I have reviewed the patients History and Physical, chart, labs and discussed the procedure including the risks, benefits and alternatives for the proposed anesthesia with the patient or authorized representative who has indicated his/her understanding and acceptance.     Dental advisory given  Plan Discussed with: CRNA and Surgeon  Anesthesia Plan Comments:          Anesthesia Quick Evaluation

## 2024-09-25 NOTE — Op Note (Signed)
 [facility] Patient Name: Scott Bauer Procedure Date: 09/25/2024 MRN: [id number] Attending MD: Sandor Flatter , MD, [id number] Date of Birth: 1956/11/22 CSN: [id number] Age: 68 Admit Type: Outpatient Procedure:                Colonoscopy Indications:              Surveillance: Personal history of adenomatous                            polyps on last colonoscopy > 3 years ago                           Last colonoscopy was 12/2019 and notable for 3 small                            3-5 mm polyps (adenoma x 1, SSP x 2) along with                            fair prep with recommendation to repeat in 2 years.                           Otherwise, no recent lower GI symptoms. Providers:                Sandor Flatter, MD, Darleene Bare, RN, Lorrayne Kitty,                            Technician Referring MD:              Medicines:                Monitored Anesthesia Care Complications:            No immediate complications. Estimated Blood Loss:     Estimated blood loss: none. Procedure:                Pre-Anesthesia Assessment:                           - Prior to the procedure, a History and Physical                            was performed, and patient medications and                            allergies were reviewed. The patient's tolerance of                            previous anesthesia was also reviewed. The risks                            and benefits of the procedure and the sedation                            options and risks were discussed with the patient.  All questions were answered, and informed consent                            was obtained. Prior Anticoagulants: The patient has                            taken no anticoagulant or antiplatelet agents. ASA                            Grade Assessment: III - A patient with severe                            systemic disease. After reviewing the risks and                             benefits, the patient was deemed in satisfactory                            condition to undergo the procedure.                           After obtaining informed consent, the colonoscope                            was passed under direct vision. Throughout the                            procedure, the patient's blood pressure, pulse, and                            oxygen saturations were monitored continuously. The                            CF-HQ190L ([id number]) Olympus colonoscope was                            introduced through the anus and advanced to the the                            terminal ileum. The colonoscopy was performed                            without difficulty. The patient tolerated the                            procedure well. The quality of the bowel                            preparation was good. The terminal ileum, ileocecal                            valve, appendiceal orifice, and rectum were  photographed. Scope In: 9:59:58 AM Scope Out: 10:14:20 AM Scope Withdrawal Time: 0 hours 12 minutes 6 seconds  Total Procedure Duration: 0 hours 14 minutes 22 seconds  Findings:      The perianal and digital rectal examinations were normal.      Multiple small-mouthed diverticula were found in the sigmoid colon,       descending colon and transverse colon.      Non-bleeding internal hemorrhoids were found during retroflexion. The       hemorrhoids were small.      The terminal ileum appeared normal. Impression:               - Diverticulosis in the sigmoid colon, in the                            descending colon and in the transverse colon.                           - Non-bleeding internal hemorrhoids.                           - The remainder of the colon was otherwise normal                            appearing.                           - The examined portion of the ileum was normal.                           - No specimens  collected. Moderate Sedation:      Not Applicable - Patient had care per Anesthesia. Recommendation:           - Patient has a contact number available for                            emergencies. The signs and symptoms of potential                            delayed complications were discussed with the                            patient. Return to normal activities tomorrow.                            Written discharge instructions were provided to the                            patient.                           - Resume previous diet.                           - Continue present medications.                           - Repeat  colonoscopy in 5 years for surveillance.                           - Return to GI office PRN. Procedure Code(s):        --- Professional ---                           H9894, Colorectal cancer screening; colonoscopy on                            individual at high risk Diagnosis Code(s):        --- Professional ---                           Z86.010, Personal history of colonic polyps                           K64.8, Other hemorrhoids                           K57.30, Diverticulosis of large intestine without                            perforation or abscess without bleeding CPT copyright 2022 American Medical Association. All rights reserved. The codes documented in this report are preliminary and upon coder review may  be revised to meet current compliance requirements. Sandor Flatter, MD 09/25/2024 10:30:13 AM Number of Addenda: 0

## 2024-09-25 NOTE — Interval H&P Note (Signed)
 History and Physical Interval Note:  09/25/2024 9:37 AM  Scott Bauer  has presented today for surgery, with the diagnosis of Screening for colon cancer; History of colon polyps.  The various methods of treatment have been discussed with the patient and family. After consideration of risks, benefits and other options for treatment, the patient has consented to  Procedure(s): COLONOSCOPY (N/A) as a surgical intervention.  The patient's history has been reviewed, patient examined, no change in status, stable for surgery.  I have reviewed the patient's chart and labs.  Questions were answered to the patient's satisfaction.     Sandor GAILS Quinteria Chisum

## 2024-09-25 NOTE — Anesthesia Postprocedure Evaluation (Signed)
 Anesthesia Post Note  Patient: Scott Bauer  Procedure(s) Performed: COLONOSCOPY     Patient location during evaluation: Endoscopy Anesthesia Type: MAC Level of consciousness: awake and alert, oriented and patient cooperative Pain management: pain level controlled Vital Signs Assessment: post-procedure vital signs reviewed and stable Respiratory status: spontaneous breathing, nonlabored ventilation and respiratory function stable Cardiovascular status: stable and blood pressure returned to baseline Postop Assessment: no apparent nausea or vomiting and able to ambulate Anesthetic complications: no   No notable events documented.  Last Vitals:  Vitals:   09/25/24 1030 09/25/24 1040  BP: 113/66 119/81  Pulse: 67 60  Resp: 14 11  Temp:    SpO2: 97% 98%    Last Pain:  Vitals:   09/25/24 1030  TempSrc:   PainSc: 0-No pain                 Lenzi Marmo,E. [place]

## 2024-09-26 ENCOUNTER — Encounter (HOSPITAL_COMMUNITY): Payer: Self-pay | Admitting: Gastroenterology

## 2024-10-15 ENCOUNTER — Encounter: Payer: Self-pay | Admitting: Family Medicine

## 2024-10-15 DIAGNOSIS — E119 Type 2 diabetes mellitus without complications: Secondary | ICD-10-CM

## 2024-10-15 MED ORDER — TIRZEPATIDE 5 MG/0.5ML ~~LOC~~ SOAJ: 5.0000 mg | SUBCUTANEOUS | 2 refills | Status: AC

## 2024-10-15 NOTE — Addendum Note (Signed)
 Addended by: WATT RAISIN C on: 10/15/2024 01:05 PM   Modules accepted: Orders

## 2024-11-13 DIAGNOSIS — M542 Cervicalgia: Secondary | ICD-10-CM | POA: Diagnosis not present

## 2024-11-13 DIAGNOSIS — M9901 Segmental and somatic dysfunction of cervical region: Secondary | ICD-10-CM | POA: Diagnosis not present

## 2024-11-13 DIAGNOSIS — M9902 Segmental and somatic dysfunction of thoracic region: Secondary | ICD-10-CM | POA: Diagnosis not present

## 2024-11-13 DIAGNOSIS — M9903 Segmental and somatic dysfunction of lumbar region: Secondary | ICD-10-CM | POA: Diagnosis not present

## 2024-12-21 ENCOUNTER — Encounter (HOSPITAL_COMMUNITY): Payer: Self-pay | Admitting: *Deleted
